# Patient Record
Sex: Male | Born: 1990
Health system: Southern US, Community
[De-identification: ages and names within clinical notes are randomized; demographics above are authoritative.]

## PROBLEM LIST (undated history)

## (undated) DIAGNOSIS — F329 Major depressive disorder, single episode, unspecified: Secondary | ICD-10-CM

## (undated) DIAGNOSIS — F319 Bipolar disorder, unspecified: Secondary | ICD-10-CM

## (undated) DIAGNOSIS — F32A Depression, unspecified: Secondary | ICD-10-CM

## (undated) DIAGNOSIS — F419 Anxiety disorder, unspecified: Secondary | ICD-10-CM

## (undated) DIAGNOSIS — F4310 Post-traumatic stress disorder, unspecified: Secondary | ICD-10-CM

## (undated) DIAGNOSIS — F40240 Claustrophobia: Secondary | ICD-10-CM

## (undated) HISTORY — PX: KNEE SURGERY: SHX244

---
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
## 2008-09-27 ENCOUNTER — Emergency Department: Payer: Self-pay | Admitting: Emergency Medicine

## 2008-10-03 ENCOUNTER — Emergency Department: Payer: Self-pay | Admitting: Emergency Medicine

## 2008-10-11 ENCOUNTER — Emergency Department: Payer: Self-pay | Admitting: Emergency Medicine

## 2010-11-21 ENCOUNTER — Emergency Department: Payer: Self-pay | Admitting: Emergency Medicine

## 2012-12-02 ENCOUNTER — Emergency Department: Payer: Self-pay | Admitting: Emergency Medicine

## 2013-05-26 ENCOUNTER — Emergency Department: Payer: Self-pay | Admitting: Emergency Medicine

## 2013-05-27 LAB — BETA STREP CULTURE(ARMC)

## 2013-10-06 ENCOUNTER — Emergency Department: Payer: Self-pay | Admitting: Emergency Medicine

## 2014-06-01 ENCOUNTER — Emergency Department: Payer: Self-pay | Admitting: Emergency Medicine

## 2014-11-11 ENCOUNTER — Encounter: Payer: Self-pay | Admitting: Emergency Medicine

## 2014-11-11 DIAGNOSIS — Y9389 Activity, other specified: Secondary | ICD-10-CM | POA: Insufficient documentation

## 2014-11-11 DIAGNOSIS — F3130 Bipolar disorder, current episode depressed, mild or moderate severity, unspecified: Secondary | ICD-10-CM | POA: Insufficient documentation

## 2014-11-11 DIAGNOSIS — F1410 Cocaine abuse, uncomplicated: Secondary | ICD-10-CM | POA: Insufficient documentation

## 2014-11-11 DIAGNOSIS — F4310 Post-traumatic stress disorder, unspecified: Secondary | ICD-10-CM | POA: Insufficient documentation

## 2014-11-11 DIAGNOSIS — Y9289 Other specified places as the place of occurrence of the external cause: Secondary | ICD-10-CM | POA: Insufficient documentation

## 2014-11-11 DIAGNOSIS — Y288XXA Contact with other sharp object, undetermined intent, initial encounter: Secondary | ICD-10-CM | POA: Insufficient documentation

## 2014-11-11 DIAGNOSIS — Z720 Tobacco use: Secondary | ICD-10-CM | POA: Insufficient documentation

## 2014-11-11 DIAGNOSIS — F1210 Cannabis abuse, uncomplicated: Secondary | ICD-10-CM | POA: Insufficient documentation

## 2014-11-11 DIAGNOSIS — Y998 Other external cause status: Secondary | ICD-10-CM | POA: Insufficient documentation

## 2014-11-11 DIAGNOSIS — S61011A Laceration without foreign body of right thumb without damage to nail, initial encounter: Secondary | ICD-10-CM | POA: Insufficient documentation

## 2014-11-11 LAB — URINALYSIS COMPLETE WITH MICROSCOPIC (ARMC ONLY)
BILIRUBIN URINE: NEGATIVE
Bacteria, UA: NONE SEEN
Glucose, UA: NEGATIVE mg/dL
HGB URINE DIPSTICK: NEGATIVE
KETONES UR: NEGATIVE mg/dL
LEUKOCYTES UA: NEGATIVE
Nitrite: NEGATIVE
PH: 6 (ref 5.0–8.0)
Protein, ur: NEGATIVE mg/dL
SPECIFIC GRAVITY, URINE: 1.021 (ref 1.005–1.030)
Squamous Epithelial / LPF: NONE SEEN

## 2014-11-11 LAB — COMPREHENSIVE METABOLIC PANEL
ALT: 22 U/L (ref 17–63)
AST: 26 U/L (ref 15–41)
Albumin: 4.6 g/dL (ref 3.5–5.0)
Alkaline Phosphatase: 60 U/L (ref 38–126)
Anion gap: 9 (ref 5–15)
BUN: 14 mg/dL (ref 6–20)
CHLORIDE: 104 mmol/L (ref 101–111)
CO2: 26 mmol/L (ref 22–32)
Calcium: 9.1 mg/dL (ref 8.9–10.3)
Creatinine, Ser: 0.82 mg/dL (ref 0.61–1.24)
Glucose, Bld: 99 mg/dL (ref 65–99)
POTASSIUM: 3.7 mmol/L (ref 3.5–5.1)
Sodium: 139 mmol/L (ref 135–145)
Total Bilirubin: 0.6 mg/dL (ref 0.3–1.2)
Total Protein: 8 g/dL (ref 6.5–8.1)

## 2014-11-11 LAB — URINE DRUG SCREEN, QUALITATIVE (ARMC ONLY)
Amphetamines, Ur Screen: NOT DETECTED
BARBITURATES, UR SCREEN: NOT DETECTED
BENZODIAZEPINE, UR SCRN: NOT DETECTED
Cannabinoid 50 Ng, Ur ~~LOC~~: POSITIVE — AB
Cocaine Metabolite,Ur ~~LOC~~: POSITIVE — AB
MDMA (Ecstasy)Ur Screen: NOT DETECTED
Methadone Scn, Ur: NOT DETECTED
OPIATE, UR SCREEN: NOT DETECTED
PHENCYCLIDINE (PCP) UR S: NOT DETECTED
Tricyclic, Ur Screen: NOT DETECTED

## 2014-11-11 LAB — CBC WITH DIFFERENTIAL/PLATELET
BASOS ABS: 0.1 10*3/uL (ref 0–0.1)
BASOS PCT: 1 %
EOS ABS: 0.1 10*3/uL (ref 0–0.7)
EOS PCT: 1 %
HCT: 46.6 % (ref 40.0–52.0)
Hemoglobin: 15.4 g/dL (ref 13.0–18.0)
LYMPHS PCT: 29 %
Lymphs Abs: 2.6 10*3/uL (ref 1.0–3.6)
MCH: 30.7 pg (ref 26.0–34.0)
MCHC: 33 g/dL (ref 32.0–36.0)
MCV: 93.1 fL (ref 80.0–100.0)
MONO ABS: 0.8 10*3/uL (ref 0.2–1.0)
Monocytes Relative: 9 %
Neutro Abs: 5.4 10*3/uL (ref 1.4–6.5)
Neutrophils Relative %: 60 %
PLATELETS: 270 10*3/uL (ref 150–440)
RBC: 5.01 MIL/uL (ref 4.40–5.90)
RDW: 13.9 % (ref 11.5–14.5)
WBC: 8.9 10*3/uL (ref 3.8–10.6)

## 2014-11-11 LAB — ETHANOL: ALCOHOL ETHYL (B): 95 mg/dL — AB (ref <5)

## 2014-11-11 NOTE — ED Notes (Signed)
Pt arrived to the ED via [facility] department for voluntary commitment. Pt states that he had an argument with his wife over the phone and threw a picture frame and cut himself in there right hand. He went to his mother's house and made a statement: "good bye" and his mother called the police and came under voluntary commitment. Pt is a [place] veteran and states that he has PTSD and his marital problems are making his really depressed.

## 2014-11-12 ENCOUNTER — Emergency Department
Admission: EM | Admit: 2014-11-12 | Discharge: 2014-11-12 | Discharge status: Against Medical Advice | Payer: Self-pay | Attending: Emergency Medicine | Admitting: Emergency Medicine

## 2014-11-12 DIAGNOSIS — F4310 Post-traumatic stress disorder, unspecified: Secondary | ICD-10-CM

## 2014-11-12 DIAGNOSIS — F32A Depression, unspecified: Secondary | ICD-10-CM

## 2014-11-12 DIAGNOSIS — F329 Major depressive disorder, single episode, unspecified: Secondary | ICD-10-CM

## 2014-11-12 HISTORY — DX: Claustrophobia: F40.240

## 2014-11-12 HISTORY — DX: Bipolar disorder, unspecified: F31.9

## 2014-11-12 HISTORY — DX: Major depressive disorder, single episode, unspecified: F32.9

## 2014-11-12 HISTORY — DX: Anxiety disorder, unspecified: F41.9

## 2014-11-12 HISTORY — DX: Depression, unspecified: F32.A

## 2014-11-12 HISTORY — DX: Post-traumatic stress disorder, unspecified: F43.10

## 2014-11-12 NOTE — ED Notes (Signed)
Pt called out to nurses station and reported wanting to leave ER medical facility. MD made aware. MD advised to contact emergency contact relatives and mother to receive more information on current situation. Attempts have been made to sister and spouse with unsuccessful outcomes. Will make MD aware.

## 2014-11-12 NOTE — ED Notes (Signed)

## 2014-11-12 NOTE — BH Assessment (Signed)
Assessment Note  Nicholas Lopez is an 24 y.o. male. Mr. Petz reports that his mother wanted him to come in tonight to seek assistance.  He reports that he was drinking a lot and his mother became worried.  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  He reports that he has been depressed lately and had a difficult time falling asleep.  He has also not been eating regularly, and has lost about 35 pounds in the last 2 months.  He denied homicidal and suicidal ideations or intent. He reports using Alcohol and marijuana as ways of coping with his anxiety.  UDS reported positive results for cannabinoids and cocaine.  Axis I: Alcohol Abuse, Rule out Major Depression and Substance Abuse Axis II: Deferred Axis III:  Past Medical History  Diagnosis Date  . PTSD (post-traumatic stress disorder)   . Depression   . Bipolar 1 disorder   . Anxiety   . Claustrophobia    Axis IV: other psychosocial or environmental problems Axis V: 51-60 moderate symptoms  Past Medical History:  Past Medical History  Diagnosis Date  . PTSD (post-traumatic stress disorder)   . Depression   . Bipolar 1 disorder   . Anxiety   . Claustrophobia     Past Surgical History  Procedure Laterality Date  . Knee surgery      Family History: History reviewed. No pertinent family history.  Social History:  reports that he has been smoking Cigarettes.  He has a 15 pack-year smoking history. He does not have any smokeless tobacco history on file. He reports that he drinks about 16.8 oz of alcohol per week. He reports that he uses illicit drugs (Marijuana and Cocaine).  Additional Social History:  Alcohol / Drug Use History of alcohol / drug use?: Yes Negative Consequences of Use: Personal relationships Substance #1 Name of Substance 1: Alcohol (Beer and Liqour) 1 - Age of First Use: 12 1 - Amount (size/oz): a lot 1 - Frequency: daily 1 - Last Use / Amount: 11/11/2014 Substance #2 Name of Substance 2:  marijuana 2 - Age of First Use: 10 2 - Amount (size/oz): 1/4 (7 blunts) 2 - Frequency: daily 2 - Last Use / Amount: 11/11/2014  CIWA: CIWA-Ar BP: 117/65 mmHg Pulse Rate: 74 COWS:    Allergies: No Known Allergies  Home Medications:  (Not in a hospital admission)  OB/GYN Status:  No LMP for male patient.  General Assessment Data Location of Assessment: [facility] ED TTS Assessment: In system Is this a Tele or Face-to-Face Assessment?: Face-to-Face Is this an Initial Assessment or a Re-assessment for this encounter?: Initial Assessment Marital status: Separated Living Arrangements: Other relatives Can pt return to current living arrangement?: Yes Admission Status: Voluntary Is patient capable of signing voluntary admission?: Yes Referral Source: Self/Family/Friend Insurance type: none  Medical Screening Exam [facility] Walk-in ONLY) Medical Exam completed: Yes  Crisis Care Plan Living Arrangements: Other relatives Name of Psychiatrist: None Name of Therapist: None  Education Status Is patient currently in school?: No Current Grade: na Highest grade of school patient has completed: 12th Name of school: n/a Contact person: na  Risk to self with the past 6 months Suicidal Ideation: No Has patient been a risk to self within the past 6 months prior to admission? : No Suicidal Intent: No Has patient had any suicidal intent within the past 6 months prior to admission? : No Is patient at risk for suicide?: No Suicidal Plan?: No Has patient had  any suicidal plan within the past 6 months prior to admission? : No Access to Means: No What has been your use of drugs/alcohol within the last 12 months?: use alcohol and marijuana all day Previous Attempts/Gestures: No How many times?: 0 Other Self Harm Risks: none reported Triggers for Past Attempts: Spouse contact (Currently seperated) Intentional Self Injurious Behavior: None Family Suicide History: Yes (Brother and sister) Recent  stressful life event(s): Divorce (divorce pending) Persecutory voices/beliefs?: No Depression: Yes Depression Symptoms: Insomnia (Alchol intake increase) Substance abuse history and/or treatment for substance abuse?: Yes Suicide prevention information given to non-admitted patients: Not applicable  Risk to Others within the past 6 months Homicidal Ideation: No Does patient have any lifetime risk of violence toward others beyond the six months prior to admission? : No Thoughts of Harm to Others: No Current Homicidal Intent: No Current Homicidal Plan: No Access to Homicidal Means: No Identified Victim: None identified History of harm to others?: No Assessment of Violence: None Noted Violent Behavior Description: none reported Does patient have access to weapons?: No Criminal Charges Pending?: No Does patient have a court date: No Is patient on probation?: No  Psychosis Hallucinations: None noted Delusions: None noted  Mental Status Report Appearance/Hygiene: In scrubs, Unremarkable Eye Contact: Fair Motor Activity: Unremarkable Speech: Unremarkable Level of Consciousness: Drowsy Mood: Pleasant Affect: Appropriate to circumstance Anxiety Level: None Thought Processes: Coherent Judgement: Partial Orientation: Person, Time, Place, Situation Obsessive Compulsive Thoughts/Behaviors: None  Cognitive Functioning Concentration: Poor Memory: Unable to Assess IQ: Average Insight: Poor Impulse Control: Unable to Assess Appetite: Poor Weight Loss: 35 (2 months) Sleep: Decreased Vegetative Symptoms: None  ADLScreening [facility] Assessment Services) Patient's cognitive ability adequate to safely complete daily activities?: Yes Patient able to express need for assistance with ADLs?: Yes Independently performs ADLs?: Yes (appropriate for developmental age)  Prior Inpatient Therapy Prior Inpatient Therapy: No  Prior Outpatient Therapy Prior Outpatient Therapy: No Does patient  have an ACCT team?: No Does patient have Intensive In-House Services?  : No Does patient have [facility] services? : No Does patient have P4CC services?: No  ADL Screening (condition at time of admission) Patient's cognitive ability adequate to safely complete daily activities?: Yes Patient able to express need for assistance with ADLs?: Yes Independently performs ADLs?: Yes (appropriate for developmental age)       Abuse/Neglect Assessment (Assessment to be complete while patient is alone) Physical Abuse: Denies Verbal Abuse: Denies Sexual Abuse: Denies Exploitation of patient/patient's resources: Denies Self-Neglect: Denies Values / Beliefs Cultural Requests During Hospitalization: None Spiritual Requests During Hospitalization: None   Advance Directives (For Healthcare) Does patient have an advance directive?: No Would patient like information on creating an advanced directive?: No - patient declined information    Additional Information 1:1 In Past 12 Months?: No CIRT Risk: No Elopement Risk: No Does patient have medical clearance?: Yes     Disposition:  Disposition Initial Assessment Completed for this Encounter: Yes Disposition of Patient: Treatment offered and refused  On Site Evaluation by:   Reviewed with Physician:    Justice Deeds 11/12/2014 7:56 AM

## 2014-11-12 NOTE — ED Provider Notes (Signed)
[facility] Emergency Department Provider Note  ____________________________________________  Time seen: Approximately 0052 AM  I have reviewed the triage vital signs and the nursing notes.   HISTORY  Chief Complaint Laceration and Psychiatric Evaluation    HPI Nicholas Lopez is a 24 y.o. male who has been having some problems with depression. The patient reports that he is a veteran and has a history of PTSD. He reports that he has been drinking a lot and his mother was worried about him. He reports that he is under a lot of stress and is undergoing a divorce with his wife. He has not been able to talk to anyone he reports in a while. The patient denies any suicidal ideation but reports that he was drinking a lot and his mother was worried. The patient reports that he threw a picture frame and cut his hand 3 days ago. He reports that he was never evaluated for it. He is unsure how much she has been drinking but reports that he drinks every day. The patient is here to see if there is someone he could talk to about his PTSD and his depression.   Past Medical History  Diagnosis Date  . PTSD (post-traumatic stress disorder)   . Depression   . Bipolar 1 disorder   . Anxiety   . Claustrophobia     There are no active problems to display for this patient.   Past Surgical History  Procedure Laterality Date  . Knee surgery      No current outpatient prescriptions on file.  Allergies Review of patient's allergies indicates no known allergies.  History reviewed. No pertinent family history.  Social History Social History  Substance Use Topics  . Smoking status: Heavy Tobacco Smoker -- 1.00 packs/day for 15 years    Types: Cigarettes  . Smokeless tobacco: None  . Alcohol Use: 16.8 oz/week    18 Shots of liquor, 10 Cans of beer per week    Review of Systems Constitutional: No fever/chills Eyes: No visual changes. ENT: No sore throat. Cardiovascular:  Denies chest pain. Respiratory: Denies shortness of breath. Gastrointestinal: No abdominal pain.  No nausea, no vomiting.  No diarrhea.  No constipation. Genitourinary: Negative for dysuria. Musculoskeletal: Negative for back pain. Skin: Negative for rash. Neurological: Negative for headaches, focal weakness or numbness.  10-point ROS otherwise negative.  ____________________________________________   PHYSICAL EXAM:  VITAL SIGNS: ED Triage Vitals  Enc Vitals Group     BP 11/11/14 2222 118/67 mmHg     Pulse Rate 11/11/14 2222 70     Resp 11/11/14 2222 18     Temp 11/11/14 2222 98.5 F (36.9 C)     Temp Source 11/11/14 2222 Oral     SpO2 11/11/14 2222 98 %     Weight 11/11/14 2222 155 lb (70.308 kg)     Height 11/11/14 2222 5\' 7"  (1.702 m)     Head Cir --      Peak Flow --      Pain Score 11/11/14 2224 7     Pain Loc --      Pain Edu? --      Excl. in GC? --     Constitutional: Alert and oriented. Well appearing and in no acute distress. Eyes: Conjunctivae are normal. PERRL. EOMI. Head: Atraumatic. Nose: No congestion/rhinnorhea. Mouth/Throat: Mucous membranes are moist.  Oropharynx non-erythematous. Cardiovascular: Normal rate, regular rhythm. Grossly normal heart sounds.  Good peripheral circulation. Respiratory: Normal respiratory effort.  No  retractions. Lungs CTAB. Gastrointestinal: Soft and nontender. No distention. Positive bowel sounds Musculoskeletal: No lower extremity tenderness nor edema.   Neurologic:  Normal speech and language. No gross focal neurologic deficits are appreciated.  Skin:  Healing, not bleeding laceration to the base of the patient's right thumb in a normal pain to palpation passive range of motion intact. Psychiatric: Mood and affect are normal.  ____________________________________________   LABS (all labs ordered are listed, but only abnormal results are displayed)  Labs Reviewed  ETHANOL - Abnormal; Notable for the following:     Alcohol, Ethyl (B) 95 (*)    All other components within normal limits  URINALYSIS COMPLETEWITH MICROSCOPIC ([facility] ONLY) - Abnormal; Notable for the following:    Color, Urine YELLOW (*)    APPearance CLEAR (*)    All other components within normal limits  URINE DRUG SCREEN, QUALITATIVE ([facility] ONLY) - Abnormal; Notable for the following:    Cocaine Metabolite,Ur [place] POSITIVE (*)    Cannabinoid 50 Ng, Ur [place] POSITIVE (*)    All other components within normal limits  CBC WITH DIFFERENTIAL/PLATELET  COMPREHENSIVE METABOLIC PANEL   ____________________________________________  EKG  None ____________________________________________  RADIOLOGY  None ____________________________________________   PROCEDURES  Procedure(s) performed: None  Critical Care performed: No  ____________________________________________   INITIAL IMPRESSION / ASSESSMENT AND PLAN / ED COURSE  Pertinent labs & imaging results that were available during my care of the patient were reviewed by me and considered in my medical decision making (see chart for details).  This is a 24 year old male who is a veteran with PTSD has been having a lot of depression. The patient is here saying he just wants to talk to someone about his depression. The patient denies any suicidal or homicidal ideation at this time. We are awaiting evaluation by psych in the morning.  The patient initially slept well in the emergency department. After a few hours he decided he no longer wanted to be here. The patient denies any suicidal thoughts of suicidal ideation. He reports that he is stressed and having a hard time especially given the divorce that he is going through with his wife. We informed the patient that he will be seen by the psychiatrist this morning but he decided he no longer wanted to stay. The patient decided to sign out AGAINST MEDICAL ADVICE. The patient did not want to wait any longer to see the psychiatrist.   ____________________________________________   FINAL CLINICAL IMPRESSION(S) / ED DIAGNOSES  Final diagnoses:  Depression  PTSD (post-traumatic stress disorder)      Rebecka Apley, MD 11/12/14 [phone]

## 2014-11-12 NOTE — ED Notes (Signed)
Pt left ER medical facility AMA. Pt given opportunity to stay until morning to speak to a psychiatrist. Pt denied opportunity to stay for further treatment. MD made aware of pt response. Pt left ER facility with steady gait and in NAD.

## 2020-03-28 HISTORY — PX: CHEST TUBE INSERTION: SHX231

## 2020-04-16 ENCOUNTER — Inpatient Hospital Stay (HOSPITAL_COMMUNITY): Payer: No Typology Code available for payment source

## 2020-04-16 ENCOUNTER — Encounter (HOSPITAL_COMMUNITY): Payer: Self-pay | Admitting: Emergency Medicine

## 2020-04-16 ENCOUNTER — Inpatient Hospital Stay (HOSPITAL_COMMUNITY)
Admission: EM | Admit: 2020-04-16 | Discharge: 2020-04-24 | DRG: 956 | Disposition: A | Discharge status: Home Health | Payer: No Typology Code available for payment source | Attending: Surgery | Admitting: Surgery

## 2020-04-16 ENCOUNTER — Emergency Department (HOSPITAL_COMMUNITY): Payer: No Typology Code available for payment source

## 2020-04-16 ENCOUNTER — Other Ambulatory Visit: Payer: Self-pay

## 2020-04-16 DIAGNOSIS — I959 Hypotension, unspecified: Secondary | ICD-10-CM | POA: Diagnosis present

## 2020-04-16 DIAGNOSIS — S01111A Laceration without foreign body of right eyelid and periocular area, initial encounter: Secondary | ICD-10-CM | POA: Diagnosis present

## 2020-04-16 DIAGNOSIS — N179 Acute kidney failure, unspecified: Secondary | ICD-10-CM | POA: Diagnosis present

## 2020-04-16 DIAGNOSIS — E876 Hypokalemia: Secondary | ICD-10-CM | POA: Diagnosis present

## 2020-04-16 DIAGNOSIS — J156 Pneumonia due to other aerobic Gram-negative bacteria: Secondary | ICD-10-CM | POA: Diagnosis not present

## 2020-04-16 DIAGNOSIS — J15212 Pneumonia due to Methicillin resistant Staphylococcus aureus: Secondary | ICD-10-CM | POA: Diagnosis not present

## 2020-04-16 DIAGNOSIS — S01119A Laceration without foreign body of unspecified eyelid and periocular area, initial encounter: Secondary | ICD-10-CM

## 2020-04-16 DIAGNOSIS — S2249XA Multiple fractures of ribs, unspecified side, initial encounter for closed fracture: Secondary | ICD-10-CM

## 2020-04-16 DIAGNOSIS — R402421 Glasgow coma scale score 9-12, in the field [EMT or ambulance]: Secondary | ICD-10-CM | POA: Diagnosis present

## 2020-04-16 DIAGNOSIS — S2500XA Unspecified injury of thoracic aorta, initial encounter: Secondary | ICD-10-CM

## 2020-04-16 DIAGNOSIS — J439 Emphysema, unspecified: Secondary | ICD-10-CM | POA: Diagnosis present

## 2020-04-16 DIAGNOSIS — S82002A Unspecified fracture of left patella, initial encounter for closed fracture: Secondary | ICD-10-CM

## 2020-04-16 DIAGNOSIS — S62512A Displaced fracture of proximal phalanx of left thumb, initial encounter for closed fracture: Secondary | ICD-10-CM

## 2020-04-16 DIAGNOSIS — Z20822 Contact with and (suspected) exposure to covid-19: Secondary | ICD-10-CM | POA: Diagnosis present

## 2020-04-16 DIAGNOSIS — S0531XA Ocular laceration without prolapse or loss of intraocular tissue, right eye, initial encounter: Secondary | ICD-10-CM | POA: Diagnosis present

## 2020-04-16 DIAGNOSIS — S72462A Displaced supracondylar fracture with intracondylar extension of lower end of left femur, initial encounter for closed fracture: Secondary | ICD-10-CM

## 2020-04-16 DIAGNOSIS — S53105A Unspecified dislocation of left ulnohumeral joint, initial encounter: Secondary | ICD-10-CM

## 2020-04-16 DIAGNOSIS — D62 Acute posthemorrhagic anemia: Secondary | ICD-10-CM | POA: Diagnosis present

## 2020-04-16 DIAGNOSIS — R0902 Hypoxemia: Secondary | ICD-10-CM

## 2020-04-16 DIAGNOSIS — S36115A Moderate laceration of liver, initial encounter: Secondary | ICD-10-CM | POA: Diagnosis present

## 2020-04-16 DIAGNOSIS — S2243XA Multiple fractures of ribs, bilateral, initial encounter for closed fracture: Secondary | ICD-10-CM | POA: Diagnosis present

## 2020-04-16 DIAGNOSIS — S72302A Unspecified fracture of shaft of left femur, initial encounter for closed fracture: Secondary | ICD-10-CM

## 2020-04-16 DIAGNOSIS — Z01818 Encounter for other preprocedural examination: Secondary | ICD-10-CM

## 2020-04-16 DIAGNOSIS — T148XXA Other injury of unspecified body region, initial encounter: Secondary | ICD-10-CM

## 2020-04-16 DIAGNOSIS — T1490XA Injury, unspecified, initial encounter: Secondary | ICD-10-CM | POA: Diagnosis present

## 2020-04-16 DIAGNOSIS — S36039A Unspecified laceration of spleen, initial encounter: Secondary | ICD-10-CM | POA: Diagnosis present

## 2020-04-16 DIAGNOSIS — J939 Pneumothorax, unspecified: Secondary | ICD-10-CM

## 2020-04-16 DIAGNOSIS — S32029A Unspecified fracture of second lumbar vertebra, initial encounter for closed fracture: Secondary | ICD-10-CM | POA: Diagnosis present

## 2020-04-16 DIAGNOSIS — S2242XA Multiple fractures of ribs, left side, initial encounter for closed fracture: Secondary | ICD-10-CM

## 2020-04-16 DIAGNOSIS — Z9289 Personal history of other medical treatment: Secondary | ICD-10-CM

## 2020-04-16 DIAGNOSIS — S81811A Laceration without foreign body, right lower leg, initial encounter: Secondary | ICD-10-CM | POA: Diagnosis present

## 2020-04-16 DIAGNOSIS — J9601 Acute respiratory failure with hypoxia: Secondary | ICD-10-CM

## 2020-04-16 DIAGNOSIS — S3509XA Other injury of abdominal aorta, initial encounter: Secondary | ICD-10-CM

## 2020-04-16 DIAGNOSIS — S72362A Displaced segmental fracture of shaft of left femur, initial encounter for closed fracture: Secondary | ICD-10-CM | POA: Diagnosis present

## 2020-04-16 DIAGNOSIS — J9690 Respiratory failure, unspecified, unspecified whether with hypoxia or hypercapnia: Secondary | ICD-10-CM

## 2020-04-16 DIAGNOSIS — Y92410 Unspecified street and highway as the place of occurrence of the external cause: Secondary | ICD-10-CM

## 2020-04-16 DIAGNOSIS — S36113A Laceration of liver, unspecified degree, initial encounter: Secondary | ICD-10-CM

## 2020-04-16 DIAGNOSIS — S53115A Anterior dislocation of left ulnohumeral joint, initial encounter: Secondary | ICD-10-CM

## 2020-04-16 DIAGNOSIS — S7292XA Unspecified fracture of left femur, initial encounter for closed fracture: Secondary | ICD-10-CM

## 2020-04-16 DIAGNOSIS — S270XXA Traumatic pneumothorax, initial encounter: Secondary | ICD-10-CM | POA: Diagnosis present

## 2020-04-16 DIAGNOSIS — Z4659 Encounter for fitting and adjustment of other gastrointestinal appliance and device: Secondary | ICD-10-CM

## 2020-04-16 DIAGNOSIS — S72002A Fracture of unspecified part of neck of left femur, initial encounter for closed fracture: Secondary | ICD-10-CM

## 2020-04-16 DIAGNOSIS — M25531 Pain in right wrist: Secondary | ICD-10-CM

## 2020-04-16 DIAGNOSIS — R202 Paresthesia of skin: Secondary | ICD-10-CM | POA: Diagnosis present

## 2020-04-16 DIAGNOSIS — Z4682 Encounter for fitting and adjustment of non-vascular catheter: Secondary | ICD-10-CM

## 2020-04-16 DIAGNOSIS — Z938 Other artificial opening status: Secondary | ICD-10-CM

## 2020-04-16 DIAGNOSIS — S62509A Fracture of unspecified phalanx of unspecified thumb, initial encounter for closed fracture: Secondary | ICD-10-CM

## 2020-04-16 LAB — URINALYSIS, ROUTINE W REFLEX MICROSCOPIC
Bilirubin Urine: NEGATIVE
Glucose, UA: 150 mg/dL — AB
Ketones, ur: NEGATIVE mg/dL
Leukocytes,Ua: NEGATIVE
Nitrite: NEGATIVE
Protein, ur: 100 mg/dL — AB
RBC / HPF: >50 RBC/hpf — ABNORMAL HIGH (ref 0–5)
Specific Gravity, Urine: 1.04 — ABNORMAL HIGH (ref 1.005–1.030)
pH: 7 (ref 5.0–8.0)

## 2020-04-16 LAB — PROTIME-INR
INR: 1.1 (ref 0.8–1.2)
Prothrombin Time: 14.1 seconds (ref 11.4–15.2)

## 2020-04-16 LAB — I-STAT CHEM 8, ED
BUN: 20 mg/dL (ref 6–20)
Calcium, Ion: 1.06 mmol/L — ABNORMAL LOW (ref 1.15–1.40)
Chloride: 105 mmol/L (ref 98–111)
Creatinine, Ser: 1.3 mg/dL — ABNORMAL HIGH (ref 0.61–1.24)
Glucose, Bld: 149 mg/dL — ABNORMAL HIGH (ref 70–99)
HCT: 49 % (ref 39.0–52.0)
Hemoglobin: 16.7 g/dL (ref 13.0–17.0)
Potassium: 2.7 mmol/L — CL (ref 3.5–5.1)
Sodium: 140 mmol/L (ref 135–145)
TCO2: 20 mmol/L — ABNORMAL LOW (ref 22–32)

## 2020-04-16 LAB — I-STAT ARTERIAL BLOOD GAS, ED
Acid-base deficit: 2 mmol/L (ref 0.0–2.0)
Bicarbonate: 24.5 mmol/L (ref 20.0–28.0)
Calcium, Ion: 1.14 mmol/L — ABNORMAL LOW (ref 1.15–1.40)
HCT: 37 % — ABNORMAL LOW (ref 39.0–52.0)
Hemoglobin: 12.6 g/dL — ABNORMAL LOW (ref 13.0–17.0)
O2 Saturation: 100 %
Patient temperature: 96.1
Potassium: 3.5 mmol/L (ref 3.5–5.1)
Sodium: 141 mmol/L (ref 135–145)
TCO2: 26 mmol/L (ref 22–32)
pCO2 arterial: 45.9 mmHg (ref 32.0–48.0)
pH, Arterial: 7.328 — ABNORMAL LOW (ref 7.350–7.450)
pO2, Arterial: 342 mmHg — ABNORMAL HIGH (ref 83.0–108.0)

## 2020-04-16 LAB — COMPREHENSIVE METABOLIC PANEL
ALT: 514 U/L — ABNORMAL HIGH (ref 0–44)
AST: 773 U/L — ABNORMAL HIGH (ref 15–41)
Albumin: 4.3 g/dL (ref 3.5–5.0)
Alkaline Phosphatase: 56 U/L (ref 38–126)
Anion gap: 21 — ABNORMAL HIGH (ref 5–15)
BUN: 16 mg/dL (ref 6–20)
CO2: 18 mmol/L — ABNORMAL LOW (ref 22–32)
Calcium: 9.2 mg/dL (ref 8.9–10.3)
Chloride: 101 mmol/L (ref 98–111)
Creatinine, Ser: 1.4 mg/dL — ABNORMAL HIGH (ref 0.61–1.24)
GFR, Estimated: >60 mL/min (ref >60)
Glucose, Bld: 160 mg/dL — ABNORMAL HIGH (ref 70–99)
Potassium: 2.7 mmol/L — CL (ref 3.5–5.1)
Sodium: 140 mmol/L (ref 135–145)
Total Bilirubin: 1 mg/dL (ref 0.3–1.2)
Total Protein: 7.8 g/dL (ref 6.5–8.1)

## 2020-04-16 LAB — CBC
HCT: 47.3 % (ref 39.0–52.0)
Hemoglobin: 15.9 g/dL (ref 13.0–17.0)
MCH: 31.8 pg (ref 26.0–34.0)
MCHC: 33.6 g/dL (ref 30.0–36.0)
MCV: 94.6 fL (ref 80.0–100.0)
Platelets: 345 10*3/uL (ref 150–400)
RBC: 5 MIL/uL (ref 4.22–5.81)
RDW: 13.5 % (ref 11.5–15.5)
WBC: 22.2 10*3/uL — ABNORMAL HIGH (ref 4.0–10.5)
nRBC: 0 % (ref 0.0–0.2)

## 2020-04-16 LAB — SAMPLE TO BLOOD BANK

## 2020-04-16 LAB — ETHANOL: Alcohol, Ethyl (B): <10 mg/dL (ref <10)

## 2020-04-16 LAB — SARS CORONAVIRUS 2 BY RT PCR (HOSPITAL ORDER, PERFORMED IN ~~LOC~~ HOSPITAL LAB): SARS Coronavirus 2: NEGATIVE

## 2020-04-16 LAB — LACTIC ACID, PLASMA: Lactic Acid, Venous: 9.7 mmol/L (ref 0.5–1.9)

## 2020-04-16 MED ORDER — FENTANYL BOLUS VIA INFUSION
50.0000 ug | INTRAVENOUS | Status: DC | PRN
Start: 2020-04-16 — End: 2020-04-18
  Filled 2020-04-16: qty 50

## 2020-04-16 MED ORDER — FENTANYL CITRATE (PF) 100 MCG/2ML IJ SOLN
INTRAMUSCULAR | Status: AC
  Filled 2020-04-16: qty 2

## 2020-04-16 MED ORDER — FENTANYL CITRATE (PF) 100 MCG/2ML IJ SOLN: 50.0000 ug | Freq: Once | INTRAMUSCULAR | Status: DC

## 2020-04-16 MED ORDER — PROPOFOL 10 MG/ML IV BOLUS
INTRAVENOUS | Status: AC
  Filled 2020-04-16: qty 20

## 2020-04-16 MED ORDER — PROPOFOL 1000 MG/100ML IV EMUL: 5.0000 ug/kg/min | INTRAVENOUS | Status: DC

## 2020-04-16 MED ORDER — ETOMIDATE 2 MG/ML IV SOLN
INTRAVENOUS | Status: AC | PRN
  Administered 2020-04-16: 20 mg via INTRAVENOUS

## 2020-04-16 MED ORDER — SODIUM CHLORIDE 0.9 % IV SOLN
INTRAVENOUS | Status: AC | PRN
  Administered 2020-04-16: 500 mL via INTRAVENOUS

## 2020-04-16 MED ORDER — IOHEXOL 300 MG/ML  SOLN
100.0000 mL | Freq: Once | INTRAMUSCULAR | Status: AC | PRN
  Administered 2020-04-16: 100 mL via INTRAVENOUS

## 2020-04-16 MED ORDER — DOCUSATE SODIUM 50 MG/5ML PO LIQD
100.0000 mg | Freq: Two times a day (BID) | ORAL | Status: DC
  Administered 2020-04-17 – 2020-04-20 (×7): 100 mg
  Filled 2020-04-16 (×9): qty 10

## 2020-04-16 MED ORDER — ONDANSETRON HCL 4 MG/2ML IJ SOLN
4.0000 mg | Freq: Four times a day (QID) | INTRAMUSCULAR | Status: DC | PRN
  Administered 2020-04-22: 4 mg via INTRAVENOUS
  Filled 2020-04-16: qty 2

## 2020-04-16 MED ORDER — FENTANYL CITRATE (PF) 100 MCG/2ML IJ SOLN
INTRAMUSCULAR | Status: AC | PRN
  Administered 2020-04-16: 100 ug via INTRAVENOUS

## 2020-04-16 MED ORDER — PROPOFOL 1000 MG/100ML IV EMUL
0.0000 ug/kg/min | INTRAVENOUS | Status: DC
  Administered 2020-04-16: 20 ug/kg/min via INTRAVENOUS
  Administered 2020-04-17 – 2020-04-18 (×2): 40 ug/kg/min via INTRAVENOUS
  Administered 2020-04-18: 50 ug/kg/min via INTRAVENOUS
  Filled 2020-04-16 (×3): qty 100

## 2020-04-16 MED ORDER — ONDANSETRON 4 MG PO TBDP: 4.0000 mg | ORAL_TABLET | Freq: Four times a day (QID) | ORAL | Status: DC | PRN

## 2020-04-16 MED ORDER — POTASSIUM CHLORIDE IN NACL 20-0.9 MEQ/L-% IV SOLN
INTRAVENOUS | Status: DC
  Filled 2020-04-16 (×4): qty 1000

## 2020-04-16 MED ORDER — POLYETHYLENE GLYCOL 3350 17 G PO PACK
17.0000 g | PACK | Freq: Every day | ORAL | Status: DC
  Administered 2020-04-18 – 2020-04-20 (×3): 17 g
  Filled 2020-04-16 (×3): qty 1

## 2020-04-16 MED ORDER — ENOXAPARIN SODIUM 30 MG/0.3ML ~~LOC~~ SOLN
30.0000 mg | Freq: Two times a day (BID) | SUBCUTANEOUS | Status: DC
  Administered 2020-04-18 (×2): 30 mg via SUBCUTANEOUS
  Filled 2020-04-16 (×2): qty 0.3

## 2020-04-16 MED ORDER — PROPOFOL 1000 MG/100ML IV EMUL
INTRAVENOUS | Status: AC | PRN
  Administered 2020-04-16: 5 ug via INTRAVENOUS

## 2020-04-16 MED ORDER — METOPROLOL TARTRATE 5 MG/5ML IV SOLN: 5.0000 mg | Freq: Four times a day (QID) | INTRAVENOUS | Status: DC | PRN

## 2020-04-16 MED ORDER — FENTANYL 2500MCG IN NS 250ML (10MCG/ML) PREMIX INFUSION
50.0000 ug/h | INTRAVENOUS | Status: DC
  Administered 2020-04-16: 100 ug/h via INTRAVENOUS
  Administered 2020-04-17: 200 ug/h via INTRAVENOUS
  Filled 2020-04-16 (×3): qty 250

## 2020-04-16 MED ORDER — FENTANYL CITRATE (PF) 100 MCG/2ML IJ SOLN
INTRAMUSCULAR | Status: AC | PRN
  Administered 2020-04-16 (×2): 50 ug via INTRAVENOUS

## 2020-04-16 MED ORDER — PANTOPRAZOLE SODIUM 40 MG IV SOLR
40.0000 mg | Freq: Every day | INTRAVENOUS | Status: DC
  Administered 2020-04-18 – 2020-04-20 (×3): 40 mg via INTRAVENOUS
  Filled 2020-04-16 (×3): qty 40

## 2020-04-16 MED ORDER — SUCCINYLCHOLINE CHLORIDE 20 MG/ML IJ SOLN
INTRAMUSCULAR | Status: AC | PRN
  Administered 2020-04-16: 100 mg via INTRAVENOUS

## 2020-04-16 MED ORDER — PROPOFOL 1000 MG/100ML IV EMUL
INTRAVENOUS | Status: AC
  Administered 2020-04-16: 20 ug/kg/min via INTRAVENOUS
  Filled 2020-04-16: qty 100

## 2020-04-16 MED ORDER — PANTOPRAZOLE SODIUM 40 MG PO TBEC
40.0000 mg | DELAYED_RELEASE_TABLET | Freq: Every day | ORAL | Status: DC
  Administered 2020-04-21: 40 mg via ORAL
  Filled 2020-04-16: qty 1

## 2020-04-16 NOTE — Progress Notes (Signed)
   04/16/20 1754  Clinical Encounter Type  Visited With Patient not available (Chaplain not able to speak with the patient.)  Visit Type Trauma  Referral From  (Level 2 Page Rosanne Ashing to Level 1)  Consult/Referral To Chaplain  Chaplain received page for Level 2 that was upgraded to Level 1. Medical Team was working with patient and no family was present at this time. Chaplain was not needed.  Chaplain Tarnesha Ulloa [place] [phone]

## 2020-04-16 NOTE — ED Provider Notes (Signed)
CHEST TUBE INSERTION  Date/Time: 04/16/2020 8:48 PM Performed by: Gailen Shelter, PA Authorized by: Gailen Shelter, PA   Consent:    Consent obtained:  Emergent situation   Consent given by:  Patient   Risks, benefits, and alternatives were discussed: not applicable     Procedural risks discussed: Pt intubated. Pre-procedure details:    Skin preparation:  Chlorhexidine   Preparation: Patient was prepped and draped in the usual sterile fashion   Sedation:    Sedation type:  Deep Anesthesia:    Anesthesia method:  Local infiltration   Local anesthetic:  Lidocaine 1% w/o epi Procedure details:    Placement location:  L lateral   Tube size (French): pigtail.   Ultrasound guidance: no     Tension pneumothorax: no     Tube connected to:  Suction and water seal   Suture material:  2-0 silk   Dressing: gauze and tape. Post-procedure details:    Post-insertion x-ray findings: tube in good position     Procedure completion:  Tolerated well, no immediate complications Comments:     Left chest tube insertion was overseen by Marcille Blanco.  Marland Kitchen.Laceration Repair  Date/Time: 04/16/2020 8:51 PM Performed by: Gailen Shelter, PA Authorized by: Gailen Shelter, PA   Consent:    Consent obtained:  Verbal   Consent given by:  Patient   Risks discussed:  Infection, need for additional repair, pain, poor cosmetic result and poor wound healing   Alternatives discussed:  No treatment and delayed treatment Universal protocol:    Procedure explained and questions answered to patient or proxy's satisfaction: yes     Relevant documents present and verified: yes     Test results available: yes     Imaging studies available: yes     Required blood products, implants, devices, and special equipment available: yes     Site/side marked: yes     Immediately prior to procedure, a time out was called: yes     Patient identity confirmed:  Verbally with patient Anesthesia:    Anesthesia method:   Local infiltration Laceration details:    Location:  Leg   Leg location:  R lower leg   Length (cm):  5 Exploration:    Hemostasis achieved with:  Direct pressure   Wound extent: no foreign bodies/material noted and no tendon damage noted     Contaminated: no   Treatment:    Area cleansed with:  Saline   Amount of cleaning:  Standard   Irrigation solution:  Sterile saline   Irrigation volume:  200   Irrigation method:  Pressure wash   Visualized foreign bodies/material removed: no   Skin repair:    Repair method:  Staples   Number of staples:  5 Approximation:    Approximation:  Close Post-procedure details:    Dressing:  Open (no dressing)   Procedure completion:  Tolerated well, no immediate complications .Marland KitchenLaceration Repair  Date/Time: 04/16/2020 8:52 PM Performed by: Gailen Shelter, PA Authorized by: Gailen Shelter, PA   Consent:    Consent obtained:  Verbal   Consent given by:  Patient   Risks discussed:  Infection, need for additional repair, pain, poor cosmetic result and poor wound healing   Alternatives discussed:  No treatment and delayed treatment Universal protocol:    Procedure explained and questions answered to patient or proxy's satisfaction: yes     Relevant documents present and verified: yes     Test results available: yes  Imaging studies available: yes     Required blood products, implants, devices, and special equipment available: yes     Site/side marked: yes     Immediately prior to procedure, a time out was called: yes     Patient identity confirmed:  Verbally with patient Anesthesia:    Anesthesia method:  None Laceration details:    Location:  Leg   Leg location:  L lower leg   Length (cm):  2 Exploration:    Hemostasis achieved with:  Direct pressure   Wound exploration: wound explored through full range of motion     Wound extent: no foreign bodies/material noted and no tendon damage noted     Contaminated: no   Treatment:     Area cleansed with:  Saline   Amount of cleaning:  Standard   Irrigation solution:  Sterile saline   Irrigation volume:  200   Irrigation method:  Pressure wash   Visualized foreign bodies/material removed: no   Skin repair:    Repair method:  Staples   Number of staples:  2 Approximation:    Approximation:  Close Post-procedure details:    Dressing:  Open (no dressing)   Procedure completion:  Tolerated well, no immediate complications   Imaging studies completed. Follow-up on chest x-ray after chest tube placement.  Pneumothorax slightly decreased. IMPRESSION:  1. Possibly slightly decreased small volume bilateral  pneumothoraces.  2. Persistent, slightly increased, left chest wall emphysema.     Completed Bilateral lower extremity lacerations repaired with staples. Left-sided pneumothorax decompressed with tail catheter chest tube.   Gailen Shelter, [place] 04/16/20 2053    Pollyann Savoy, MD 04/16/20 2103

## 2020-04-16 NOTE — Progress Notes (Signed)
Orthopedic Tech Progress Note Patient Details:  Nicholas Lopez 1990/09/24 [id number]  Ortho Devices Type of Ortho Device: Knee Immobilizer,Long arm splint Ortho Device/Splint Location: LUE,LLE Ortho Device/Splint Interventions: Ordered,Application,Adjustment   Post Interventions Patient Tolerated: Poor Instructions Provided: Other (comment)   Michelle Piper 04/16/2020, 6:27 PM
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
## 2020-04-16 NOTE — Consult Note (Signed)
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  Bleeding controlled. He tolerated well.    Assessment/Plan: Right eye laceration- it was closed and should have antibiotic cream to the eyelid  BID. No sutures to remove so follow up as needed. Wound instructions should be given. No bony injuries in face.   04/16/2020, 7:33 PM
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
## 2020-04-16 NOTE — ED Notes (Signed)
Primary RN made aware of critical K level.

## 2020-04-16 NOTE — ED Notes (Signed)
Ortho Tech placed long arm splint to LUE and knee immobilizer to LLE

## 2020-04-16 NOTE — ED Provider Notes (Signed)
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 Supplies used: Ortho-Glass  .Ortho Injury Treatment  Date/Time: 04/16/2020 7:02 PM Performed by: Pollyann Savoy, MD Authorized by: Pollyann Savoy, MD   Consent:    Consent obtained:  Harvie Heck location: upper leg Location details: left upper leg Injury type: fracture Fracture type: femoral shaft Pre-procedure neurovascular assessment: neurovascularly intact Immobilization: splint Splint type: knee immobilizer. Supplies used: knee immobilizer.  Procedure Name: Intubation Date/Time: 04/16/2020 7:55 PM Performed by: Pollyann Savoy, MD Pre-anesthesia Checklist: Patient identified, Patient being monitored, Emergency Drugs available, Timeout performed and Suction available Oxygen Delivery Method: Non-rebreather mask Preoxygenation: Pre-oxygenation with 100% oxygen Induction Type: Rapid sequence Ventilation: Mask ventilation without difficulty Laryngoscope Size: Glidescope and 3 Grade View: Grade I Tube size: 8.0 mm Number of attempts: 1 Placement Confirmation: ETT inserted through vocal cords under direct vision,  CO2 detector and Breath sounds checked- equal and bilateral Secured at: 25  cm Tube secured with: ETT holder       Medications Ordered in the ED Medications  fentaNYL (SUBLIMAZE) 100 MCG/2ML injection (has no administration in time range)  fentaNYL (SUBLIMAZE) injection (50 mcg Intravenous Given 04/16/20 1821)  propofol (DIPRIVAN) 10 mg/mL bolus/IV push (has no administration in time range)  enoxaparin (LOVENOX) injection 30 mg (has no administration in time range)  0.9 % NaCl with KCl 20 mEq/ L  infusion (has no administration in time range)  ondansetron (ZOFRAN-ODT) disintegrating tablet 4 mg (has no administration in time range)    Or  ondansetron (ZOFRAN) injection 4 mg (has no administration in time range)  pantoprazole (PROTONIX) EC tablet 40 mg (has no administration in time range)    Or  pantoprazole (PROTONIX) injection 40 mg (has no administration in time range)  metoprolol tartrate (LOPRESSOR) injection 5 mg (has no administration in time range)  docusate (COLACE) 50 MG/5ML liquid 100 mg (has no administration in time range)  polyethylene glycol (MIRALAX / GLYCOLAX) packet 17 g (has no administration in time range)  fentaNYL (SUBLIMAZE) injection 50 mcg (has no administration in time range)  fentaNYL in NS (46mcg/ml) infusion-PREMIX (has no administration in time range)  fentaNYL (SUBLIMAZE) bolus via infusion 50 mcg (has no administration in time range)  propofol (DIPRIVAN) 1000  MG/100ML infusion (has no administration in time range)  etomidate (AMIDATE) injection (20 mg Intravenous Given 04/16/20 1937)  succinylcholine (ANECTINE) injection (100 mg Intravenous Given 04/16/20 1938)  propofol (DIPRIVAN) 1000 MG/100ML infusion (5 mcg Intravenous New Bag/Given 04/16/20 1939)  iohexol (OMNIPAQUE) 300 MG/ML solution 100 mL (100 mLs Intravenous Contrast Given 04/16/20 1838)     MDM Rules/Calculators/A&P MDM  ED Course  I have reviewed the triage vital signs and the nursing notes.  Pertinent labs & imaging results that were available  during my care of the patient were reviewed by me and considered in my medical decision making (see chart for details).  Clinical Course as of 04/16/20 1958  Thu Apr 16, 2020  [id number] Patient's initial BP low and tachycardic, warm saline bolus ordered and upgraded to Level 1, Dr. Corliss Skainssuei at bedside promptly to co-manage. Initial portable CXR and pelvis do no show any significant findings. Portal L elbow shows dislocation, no definite fracture. Portal L femur shows mid shaft and distal fractures with a patellar fracture. Both splinted by myself and Dr. Corliss Skainssuei with ortho tech assistance. And he was taken to CT. HR and BP improving. Fentanyl given for pain.  [CS]  1900 Dr. Corliss Skainssuei has spoke with Ortho regarding the patient's fractures and dislocations. He has also spoke with ENT about eyelid lac. Dr. Corliss Skainssuei will admit for further management.  [CS]  1921 CT images and results reviewed. Discussed with Dr. Corliss Skainssuei, given extent of his injuries and need for multiple bedside procedures, decision was made to electively intubate the patient.  [CS]    Clinical Course User Index [CS] Pollyann SavoySheldon, Elmar Antigua B, MD    Final Clinical Impression(s) / ED Diagnoses Final diagnoses:  Trauma  Closed fracture of multiple ribs of left side, initial encounter  Bilateral pneumothorax  Laceration of eyelid without involvement of lid margin  Liver laceration, closed, initial encounter  Anterior dislocation of left elbow, initial encounter  Displaced fracture of proximal phalanx of left thumb, initial encounter for closed fracture  Closed fracture of left femur, unspecified fracture morphology, initial encounter (HCC)  Closed nondisplaced fracture of left patella, unspecified fracture morphology, initial encounter  Laceration of right lower leg, initial encounter    Rx / DC Orders ED Discharge Orders    None       Pollyann SavoySheldon, Valene Villa B, MD 04/16/20 2131

## 2020-04-16 NOTE — Progress Notes (Signed)
Orthopedic Tech Progress Note Patient Details:  Jamse Arn 03/28/1877 [id number] Level 2 trauma Patient ID: Delorise Royals Doe, male   DOB: 03/28/1877, 30 y.o.   MRN: [id number]   Michelle Piper 04/16/2020, 6:07 PM

## 2020-04-16 NOTE — Consult Note (Addendum)
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 tonight, will discuss with trauma team for surgical intervention probably tomorrow  3. Will XR L thumb and further secondary survey when able.  PROCEDUREs: The patient was intubated and sedated in the emergency department the left elbow was manually reduced with longitudinal traction and flexion.  Once reduce the elbow felt stable and was splinted at 90 of flexion with the assistance of the [employer]  The left lateral proximal tibia was prepped with Betadine and a 2 mm K wire was placed percutaneously on power through the proximal tibia and out the medial side without difficulty.  A traction bow was placed and 20 pounds of traction were applied with the assistance of the ortho tech.  04/16/2020, 7:35 PM

## 2020-04-16 NOTE — ED Notes (Signed)
8ETT 25 lip at 1939
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
## 2020-04-17 ENCOUNTER — Inpatient Hospital Stay (HOSPITAL_COMMUNITY): Payer: No Typology Code available for payment source

## 2020-04-17 ENCOUNTER — Inpatient Hospital Stay (HOSPITAL_COMMUNITY): Payer: No Typology Code available for payment source | Admitting: Certified Registered"

## 2020-04-17 ENCOUNTER — Encounter (HOSPITAL_COMMUNITY): Payer: Self-pay

## 2020-04-17 ENCOUNTER — Encounter (HOSPITAL_COMMUNITY): Admission: EM | Disposition: A | Discharge status: Home Health | Payer: Self-pay | Source: Home / Self Care

## 2020-04-17 DIAGNOSIS — S53105A Unspecified dislocation of left ulnohumeral joint, initial encounter: Secondary | ICD-10-CM

## 2020-04-17 DIAGNOSIS — S2249XA Multiple fractures of ribs, unspecified side, initial encounter for closed fracture: Secondary | ICD-10-CM

## 2020-04-17 DIAGNOSIS — J939 Pneumothorax, unspecified: Secondary | ICD-10-CM

## 2020-04-17 DIAGNOSIS — S72002A Fracture of unspecified part of neck of left femur, initial encounter for closed fracture: Secondary | ICD-10-CM

## 2020-04-17 DIAGNOSIS — S2500XA Unspecified injury of thoracic aorta, initial encounter: Secondary | ICD-10-CM

## 2020-04-17 DIAGNOSIS — S36039A Unspecified laceration of spleen, initial encounter: Secondary | ICD-10-CM

## 2020-04-17 DIAGNOSIS — S72302A Unspecified fracture of shaft of left femur, initial encounter for closed fracture: Secondary | ICD-10-CM

## 2020-04-17 DIAGNOSIS — S62512A Displaced fracture of proximal phalanx of left thumb, initial encounter for closed fracture: Secondary | ICD-10-CM

## 2020-04-17 DIAGNOSIS — S82002A Unspecified fracture of left patella, initial encounter for closed fracture: Secondary | ICD-10-CM

## 2020-04-17 DIAGNOSIS — S36113A Laceration of liver, unspecified degree, initial encounter: Secondary | ICD-10-CM

## 2020-04-17 DIAGNOSIS — S72462A Displaced supracondylar fracture with intracondylar extension of lower end of left femur, initial encounter for closed fracture: Secondary | ICD-10-CM

## 2020-04-17 HISTORY — DX: Unspecified injury of thoracic aorta, initial encounter: S25.00XA

## 2020-04-17 HISTORY — DX: Pneumothorax, unspecified: J93.9

## 2020-04-17 HISTORY — DX: Unspecified dislocation of left ulnohumeral joint, initial encounter: S53.105A

## 2020-04-17 HISTORY — DX: Unspecified fracture of left patella, initial encounter for closed fracture: S82.002A

## 2020-04-17 HISTORY — DX: Fracture of unspecified part of neck of left femur, initial encounter for closed fracture: S72.002A

## 2020-04-17 HISTORY — PX: OPEN REDUCTION INTERNAL FIXATION (ORIF) METACARPAL: SHX6234

## 2020-04-17 HISTORY — DX: Multiple fractures of ribs, unspecified side, initial encounter for closed fracture: S22.49XA

## 2020-04-17 HISTORY — DX: Unspecified fracture of shaft of left femur, initial encounter for closed fracture: S72.302A

## 2020-04-17 HISTORY — DX: Laceration of liver, unspecified degree, initial encounter: S36.113A

## 2020-04-17 HISTORY — PX: FEMUR IM NAIL: SHX1597

## 2020-04-17 HISTORY — DX: Displaced fracture of proximal phalanx of left thumb, initial encounter for closed fracture: S62.512A

## 2020-04-17 HISTORY — DX: Unspecified laceration of spleen, initial encounter: S36.039A

## 2020-04-17 HISTORY — DX: Displaced supracondylar fracture with intracondylar extension of lower end of left femur, initial encounter for closed fracture: S72.462A

## 2020-04-17 LAB — COMPREHENSIVE METABOLIC PANEL
ALT: 296 U/L — ABNORMAL HIGH (ref 0–44)
AST: 438 U/L — ABNORMAL HIGH (ref 15–41)
Albumin: 2.9 g/dL — ABNORMAL LOW (ref 3.5–5.0)
Alkaline Phosphatase: 43 U/L (ref 38–126)
Anion gap: 11 (ref 5–15)
BUN: 22 mg/dL — ABNORMAL HIGH (ref 6–20)
CO2: 20 mmol/L — ABNORMAL LOW (ref 22–32)
Calcium: 7.4 mg/dL — ABNORMAL LOW (ref 8.9–10.3)
Chloride: 110 mmol/L (ref 98–111)
Creatinine, Ser: 1.67 mg/dL — ABNORMAL HIGH (ref 0.61–1.24)
GFR, Estimated: 56 mL/min — ABNORMAL LOW (ref >60)
Glucose, Bld: 126 mg/dL — ABNORMAL HIGH (ref 70–99)
Potassium: 4.2 mmol/L (ref 3.5–5.1)
Sodium: 141 mmol/L (ref 135–145)
Total Bilirubin: 0.9 mg/dL (ref 0.3–1.2)
Total Protein: 5.2 g/dL — ABNORMAL LOW (ref 6.5–8.1)

## 2020-04-17 LAB — HEMOGLOBIN AND HEMATOCRIT, BLOOD
HCT: 21.5 % — ABNORMAL LOW (ref 39.0–52.0)
Hemoglobin: 7.2 g/dL — ABNORMAL LOW (ref 13.0–17.0)

## 2020-04-17 LAB — POCT I-STAT EG7
Acid-base deficit: 5 mmol/L — ABNORMAL HIGH (ref 0.0–2.0)
Bicarbonate: 21.2 mmol/L (ref 20.0–28.0)
Calcium, Ion: 1.07 mmol/L — ABNORMAL LOW (ref 1.15–1.40)
HCT: 21 % — ABNORMAL LOW (ref 39.0–52.0)
Hemoglobin: 7.1 g/dL — ABNORMAL LOW (ref 13.0–17.0)
O2 Saturation: 94 %
Patient temperature: 35.7
Potassium: 4.7 mmol/L (ref 3.5–5.1)
Sodium: 143 mmol/L (ref 135–145)
TCO2: 23 mmol/L (ref 22–32)
pCO2, Ven: 42.8 mmHg — ABNORMAL LOW (ref 44.0–60.0)
pH, Ven: 7.296 (ref 7.250–7.430)
pO2, Ven: 72 mmHg — ABNORMAL HIGH (ref 32.0–45.0)

## 2020-04-17 LAB — CBC
HCT: 31 % — ABNORMAL LOW (ref 39.0–52.0)
HCT: 27.3 % — ABNORMAL LOW (ref 39.0–52.0)
HCT: 16.5 % — ABNORMAL LOW (ref 39.0–52.0)
Hemoglobin: 10.7 g/dL — ABNORMAL LOW (ref 13.0–17.0)
Hemoglobin: 9.2 g/dL — ABNORMAL LOW (ref 13.0–17.0)
Hemoglobin: 5.4 g/dL — CL (ref 13.0–17.0)
MCH: 32.2 pg (ref 26.0–34.0)
MCH: 32.3 pg (ref 26.0–34.0)
MCH: 32.5 pg (ref 26.0–34.0)
MCHC: 34.5 g/dL (ref 30.0–36.0)
MCHC: 33.7 g/dL (ref 30.0–36.0)
MCHC: 32.7 g/dL (ref 30.0–36.0)
MCV: 93.4 fL (ref 80.0–100.0)
MCV: 95.8 fL (ref 80.0–100.0)
MCV: 99.4 fL (ref 80.0–100.0)
Platelets: 200 10*3/uL (ref 150–400)
Platelets: 161 10*3/uL (ref 150–400)
Platelets: 136 10*3/uL — ABNORMAL LOW (ref 150–400)
RBC: 3.32 MIL/uL — ABNORMAL LOW (ref 4.22–5.81)
RBC: 2.85 MIL/uL — ABNORMAL LOW (ref 4.22–5.81)
RBC: 1.66 MIL/uL — ABNORMAL LOW (ref 4.22–5.81)
RDW: 13.8 % (ref 11.5–15.5)
RDW: 14.1 % (ref 11.5–15.5)
RDW: 13.5 % (ref 11.5–15.5)
WBC: 17.1 10*3/uL — ABNORMAL HIGH (ref 4.0–10.5)
WBC: 11.2 10*3/uL — ABNORMAL HIGH (ref 4.0–10.5)
WBC: 7.9 10*3/uL (ref 4.0–10.5)
nRBC: 0 % (ref 0.0–0.2)
nRBC: 0 % (ref 0.0–0.2)
nRBC: 2 % — ABNORMAL HIGH (ref 0.0–0.2)

## 2020-04-17 LAB — LACTIC ACID, PLASMA: Lactic Acid, Venous: 2.5 mmol/L (ref 0.5–1.9)

## 2020-04-17 LAB — POCT I-STAT, CHEM 8
BUN: 33 mg/dL — ABNORMAL HIGH (ref 6–20)
Calcium, Ion: 1.01 mmol/L — ABNORMAL LOW (ref 1.15–1.40)
Chloride: 111 mmol/L (ref 98–111)
Creatinine, Ser: 1.5 mg/dL — ABNORMAL HIGH (ref 0.61–1.24)
Glucose, Bld: 150 mg/dL — ABNORMAL HIGH (ref 70–99)
HCT: 19 % — ABNORMAL LOW (ref 39.0–52.0)
Hemoglobin: 6.5 g/dL — CL (ref 13.0–17.0)
Potassium: 6.3 mmol/L (ref 3.5–5.1)
Sodium: 141 mmol/L (ref 135–145)
TCO2: 22 mmol/L (ref 22–32)

## 2020-04-17 LAB — PREPARE RBC (CROSSMATCH)

## 2020-04-17 LAB — ABO/RH: ABO/RH(D): O POS

## 2020-04-17 LAB — HIV ANTIBODY (ROUTINE TESTING W REFLEX): HIV Screen 4th Generation wRfx: NONREACTIVE

## 2020-04-17 LAB — TRIGLYCERIDES: Triglycerides: 271 mg/dL — ABNORMAL HIGH (ref <150)

## 2020-04-17 SURGERY — INSERTION, INTRAMEDULLARY ROD, FEMUR, RETROGRADE
Anesthesia: General | Site: Thumb | Laterality: Left

## 2020-04-17 MED ORDER — CEFAZOLIN SODIUM-DEXTROSE 2-4 GM/100ML-% IV SOLN
2.0000 g | Freq: Three times a day (TID) | INTRAVENOUS | Status: AC
  Administered 2020-04-18 (×3): 2 g via INTRAVENOUS
  Filled 2020-04-17 (×4): qty 100

## 2020-04-17 MED ORDER — ONDANSETRON HCL 4 MG/2ML IJ SOLN
INTRAMUSCULAR | Status: AC
  Filled 2020-04-17: qty 2

## 2020-04-17 MED ORDER — FENTANYL CITRATE (PF) 250 MCG/5ML IJ SOLN
INTRAMUSCULAR | Status: AC
  Filled 2020-04-17: qty 5

## 2020-04-17 MED ORDER — ALBUMIN HUMAN 5 % IV SOLN: INTRAVENOUS | Status: DC | PRN

## 2020-04-17 MED ORDER — PROPOFOL 1000 MG/100ML IV EMUL
INTRAVENOUS | Status: AC
  Filled 2020-04-17: qty 100

## 2020-04-17 MED ORDER — LIDOCAINE 2% (20 MG/ML) 5 ML SYRINGE
INTRAMUSCULAR | Status: AC
  Filled 2020-04-17: qty 5

## 2020-04-17 MED ORDER — ROCURONIUM BROMIDE 10 MG/ML (PF) SYRINGE
PREFILLED_SYRINGE | INTRAVENOUS | Status: DC | PRN
  Administered 2020-04-17: 100 mg via INTRAVENOUS
  Administered 2020-04-17: 50 mg via INTRAVENOUS
  Administered 2020-04-17: 20 mg via INTRAVENOUS
  Administered 2020-04-17: 50 mg via INTRAVENOUS

## 2020-04-17 MED ORDER — SODIUM CHLORIDE 0.9% IV SOLUTION: Freq: Once | INTRAVENOUS | Status: AC

## 2020-04-17 MED ORDER — MIDAZOLAM HCL 2 MG/2ML IJ SOLN
INTRAMUSCULAR | Status: AC
  Filled 2020-04-17: qty 2

## 2020-04-17 MED ORDER — CEFAZOLIN SODIUM-DEXTROSE 2-4 GM/100ML-% IV SOLN
INTRAVENOUS | Status: AC
  Filled 2020-04-17: qty 100

## 2020-04-17 MED ORDER — PHENYLEPHRINE 40 MCG/ML (10ML) SYRINGE FOR IV PUSH (FOR BLOOD PRESSURE SUPPORT)
PREFILLED_SYRINGE | INTRAVENOUS | Status: AC
  Filled 2020-04-17: qty 10

## 2020-04-17 MED ORDER — CHLORHEXIDINE GLUCONATE CLOTH 2 % EX PADS
6.0000 | MEDICATED_PAD | Freq: Every day | CUTANEOUS | Status: DC
  Administered 2020-04-17 – 2020-04-24 (×5): 6 via TOPICAL

## 2020-04-17 MED ORDER — ALBUMIN HUMAN 5 % IV SOLN
25.0000 g | Freq: Once | INTRAVENOUS | Status: AC
  Administered 2020-04-17: 25 g via INTRAVENOUS
  Filled 2020-04-17: qty 500

## 2020-04-17 MED ORDER — VANCOMYCIN HCL 1000 MG IV SOLR
INTRAVENOUS | Status: AC
  Filled 2020-04-17: qty 1000

## 2020-04-17 MED ORDER — ROCURONIUM BROMIDE 10 MG/ML (PF) SYRINGE
PREFILLED_SYRINGE | INTRAVENOUS | Status: AC
  Filled 2020-04-17: qty 10

## 2020-04-17 MED ORDER — PROPOFOL 500 MG/50ML IV EMUL
INTRAVENOUS | Status: DC | PRN
  Administered 2020-04-17: 50 ug/kg/min via INTRAVENOUS

## 2020-04-17 MED ORDER — CEFAZOLIN SODIUM-DEXTROSE 2-3 GM-%(50ML) IV SOLR
INTRAVENOUS | Status: DC | PRN
  Administered 2020-04-17 (×2): 2 g via INTRAVENOUS

## 2020-04-17 MED ORDER — LACTATED RINGERS IV SOLN: INTRAVENOUS | Status: DC | PRN

## 2020-04-17 MED ORDER — FENTANYL CITRATE (PF) 250 MCG/5ML IJ SOLN
INTRAMUSCULAR | Status: DC | PRN
  Administered 2020-04-17 (×2): 50 ug via INTRAVENOUS
  Administered 2020-04-17: 100 ug via INTRAVENOUS
  Administered 2020-04-17 (×2): 50 ug via INTRAVENOUS
  Administered 2020-04-17: 100 ug via INTRAVENOUS
  Administered 2020-04-17 (×2): 50 ug via INTRAVENOUS

## 2020-04-17 MED ORDER — SODIUM CHLORIDE 0.9 % IV SOLN
INTRAVENOUS | Status: DC | PRN
  Administered 2020-04-17: 50 ug/h via INTRAVENOUS

## 2020-04-17 MED ORDER — VANCOMYCIN HCL 1000 MG IV SOLR
INTRAVENOUS | Status: DC | PRN
  Administered 2020-04-17: 1000 mg via TOPICAL

## 2020-04-17 MED ORDER — DEXAMETHASONE SODIUM PHOSPHATE 10 MG/ML IJ SOLN
INTRAMUSCULAR | Status: AC
  Filled 2020-04-17: qty 1

## 2020-04-17 MED ORDER — 0.9 % SODIUM CHLORIDE (POUR BTL) OPTIME
TOPICAL | Status: DC | PRN
  Administered 2020-04-17: 1000 mL

## 2020-04-17 MED ORDER — MIDAZOLAM HCL 5 MG/5ML IJ SOLN
INTRAMUSCULAR | Status: DC | PRN
  Administered 2020-04-17: 2 mg via INTRAVENOUS

## 2020-04-17 MED ORDER — PROPOFOL 10 MG/ML IV BOLUS
INTRAVENOUS | Status: AC
  Filled 2020-04-17: qty 20

## 2020-04-17 MED ORDER — DEXAMETHASONE SODIUM PHOSPHATE 10 MG/ML IJ SOLN
INTRAMUSCULAR | Status: DC | PRN
  Administered 2020-04-17: 10 mg via INTRAVENOUS

## 2020-04-17 SURGICAL SUPPLY — 89 items
ADH SKN CLS APL DERMABOND .7 (GAUZE/BANDAGES/DRESSINGS) ×2
APL PRP STRL LF DISP 70% ISPRP (MISCELLANEOUS) ×2
BIT DRILL 2.8 QC 200 STER (BIT) ×1 IMPLANT
BIT DRILL 4.2 (DRILL) IMPLANT
BIT DRILL CALIBRATED 4.2 (BIT) IMPLANT
BIT DRILL CANN 3.0 QC 215 (BIT) ×1 IMPLANT
BIT DRILL CANN LRG QC 5X300 (BIT) ×1 IMPLANT
BIT DRILL QC SFS 2.8X200 (BIT) ×1 IMPLANT
BIT DRILL SHORT 4.2 (BIT) IMPLANT
BNDG COHESIVE 4X5 TAN STRL (GAUZE/BANDAGES/DRESSINGS) ×3 IMPLANT
BNDG ELASTIC 2X5.8 VLCR STR LF (GAUZE/BANDAGES/DRESSINGS) ×1 IMPLANT
BNDG ELASTIC 3X5.8 VLCR STR LF (GAUZE/BANDAGES/DRESSINGS) ×1 IMPLANT
BNDG ELASTIC 4X5.8 VLCR STR LF (GAUZE/BANDAGES/DRESSINGS) ×4 IMPLANT
BNDG ELASTIC 6X5.8 VLCR STR LF (GAUZE/BANDAGES/DRESSINGS) ×3 IMPLANT
BRUSH SCRUB EZ PLAIN DRY (MISCELLANEOUS) ×6 IMPLANT
CHLORAPREP W/TINT 26 (MISCELLANEOUS) ×3 IMPLANT
COVER MAYO STAND STRL (DRAPES) ×3 IMPLANT
COVER SURGICAL LIGHT HANDLE (MISCELLANEOUS) ×6 IMPLANT
COVER WAND RF STERILE (DRAPES) ×3 IMPLANT
DERMABOND ADVANCED (GAUZE/BANDAGES/DRESSINGS) ×1
DERMABOND ADVANCED .7 DNX12 (GAUZE/BANDAGES/DRESSINGS) ×2 IMPLANT
DRAPE C-ARM 42X72 X-RAY (DRAPES) ×3 IMPLANT
DRAPE C-ARMOR (DRAPES) ×3 IMPLANT
DRAPE EXTREMITY T 121X128X90 (DISPOSABLE) ×1 IMPLANT
DRAPE HALF SHEET 40X57 (DRAPES) ×6 IMPLANT
DRAPE IMP U-DRAPE 54X76 (DRAPES) ×6 IMPLANT
DRAPE INCISE IOBAN 66X45 STRL (DRAPES) ×4 IMPLANT
DRAPE ORTHO SPLIT 77X108 STRL (DRAPES) ×6
DRAPE SURG 17X23 STRL (DRAPES) ×3 IMPLANT
DRAPE SURG ORHT 6 SPLT 77X108 (DRAPES) ×4 IMPLANT
DRAPE U-SHAPE 47X51 STRL (DRAPES) ×3 IMPLANT
DRILL 4.2 (DRILL) ×6
DRILL BIT CALIBRATED 4.2 (BIT) ×3
DRILL BIT SHORT 4.2 (BIT) ×6
DRSG MEPILEX BORDER 4X4 (GAUZE/BANDAGES/DRESSINGS) ×3 IMPLANT
DRSG MEPILEX BORDER 4X8 (GAUZE/BANDAGES/DRESSINGS) ×1 IMPLANT
ELECT REM PT RETURN 9FT ADLT (ELECTROSURGICAL) ×3
ELECTRODE REM PT RTRN 9FT ADLT (ELECTROSURGICAL) ×2 IMPLANT
GAUZE SPONGE 4X4 12PLY STRL (GAUZE/BANDAGES/DRESSINGS) ×3 IMPLANT
GLOVE BIO SURGEON STRL SZ 6.5 (GLOVE) ×9 IMPLANT
GLOVE BIO SURGEON STRL SZ7.5 (GLOVE) ×12 IMPLANT
GLOVE BIOGEL PI IND STRL 7.5 (GLOVE) ×2 IMPLANT
GLOVE BIOGEL PI INDICATOR 7.5 (GLOVE) ×1
GLOVE SURG UNDER POLY LF SZ6.5 (GLOVE) ×3 IMPLANT
GOWN STRL REUS W/ TWL LRG LVL3 (GOWN DISPOSABLE) ×4 IMPLANT
GOWN STRL REUS W/TWL LRG LVL3 (GOWN DISPOSABLE) ×6
GUIDEWIRE 1.6X220 TOCAR TIP (WIRE) ×5 IMPLANT
GUIDEWIRE 3.2X400 (WIRE) ×2 IMPLANT
GUIDEWIRE THREADED 2.8 (WIRE) ×3 IMPLANT
K-WIRE PLA 9 .062 (WIRE) ×2 IMPLANT
KIT BASIN OR (CUSTOM PROCEDURE TRAY) ×3 IMPLANT
KIT TURNOVER KIT B (KITS) ×3 IMPLANT
NAIL RFNA BEND 9X360 5D (Nail) ×1 IMPLANT
PACK ORTHO EXTREMITY (CUSTOM PROCEDURE TRAY) ×3 IMPLANT
PAD ARMBOARD 7.5X6 YLW CONV (MISCELLANEOUS) ×6 IMPLANT
PAD CAST 3X4 CTTN HI CHSV (CAST SUPPLIES) IMPLANT
PAD CAST 4YDX4 CTTN HI CHSV (CAST SUPPLIES) IMPLANT
PADDING CAST ABS 4INX4YD NS (CAST SUPPLIES) ×2
PADDING CAST ABS COTTON 4X4 ST (CAST SUPPLIES) IMPLANT
PADDING CAST COTTON 3X4 STRL (CAST SUPPLIES) ×3
PADDING CAST COTTON 4X4 STRL (CAST SUPPLIES) ×3
REAMER ROD DEEP FLUTE 2.5X950 (INSTRUMENTS) ×1 IMPLANT
SCREW CANN 16 THRD/85 6.5 (Screw) ×1 IMPLANT
SCREW CANN 6.5X75MM (Screw) ×1 IMPLANT
SCREW CANN 6.5X80MM (Screw) ×1 IMPLANT
SCREW CANN 6.5X95 (Screw) ×1 IMPLANT
SCREW CANN HDLS 4.5X75 (Screw) ×1 IMPLANT
SCREW CANN HDLS LNG 4.5X70 (Screw) ×2 IMPLANT
SCREW LOCK IM 5X36 (Screw) ×3 IMPLANT
SCREW LOCK IM 5X86 (Screw) ×1 IMPLANT
SCREW LOCK IM NL 5X38 (Screw) ×1 IMPLANT
SCREW LOCK IM NL 5X64 (Screw) ×1 IMPLANT
SCREW LOCK IM NL 5X74 (Screw) ×1 IMPLANT
SCREW LOCK IM NL 5X76 (Screw) ×1 IMPLANT
SCREW LOCK X25 36X5X IM NL (Screw) IMPLANT
SCREW SHANZ 5.0X175MM (Screw) ×2 IMPLANT
SLING ARM FOAM STRAP XLG (SOFTGOODS) ×1 IMPLANT
SPONGE LAP 18X18 RF (DISPOSABLE) ×3 IMPLANT
STAPLER VISISTAT 35W (STAPLE) ×3 IMPLANT
SUT MNCRL AB 3-0 PS2 18 (SUTURE) ×7 IMPLANT
SUT VIC AB 0 CT1 27 (SUTURE)
SUT VIC AB 0 CT1 27XBRD ANBCTR (SUTURE) IMPLANT
SUT VIC AB 2-0 CT1 27 (SUTURE)
SUT VIC AB 2-0 CT1 TAPERPNT 27 (SUTURE) IMPLANT
TOWEL GREEN STERILE (TOWEL DISPOSABLE) ×6 IMPLANT
TOWEL GREEN STERILE FF (TOWEL DISPOSABLE) ×3 IMPLANT
TUBE CONNECTING 12X1/4 (SUCTIONS) ×3 IMPLANT
WASHER FOR 5.0 SCREWS (Washer) ×1 IMPLANT
YANKAUER SUCT BULB TIP NO VENT (SUCTIONS) ×3 IMPLANT
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
## 2020-04-17 NOTE — ED Notes (Signed)
Wife Cheri Guppy number given to [facility] [phone]

## 2020-04-17 NOTE — Progress Notes (Signed)
CRITICAL VALUE ALERT  Critical Value:  Hgb 5.4  Date & Time Notied:  04/17/20 2200  Provider Notified: MD Trauma Dr Freida Busman  Orders Received/Actions taken: Awaiting orders for 2 units transfusion

## 2020-04-17 NOTE — ED Notes (Signed)
Patient status unchanged bilateral pedal pulses presents good cap refill.

## 2020-04-17 NOTE — ED Notes (Signed)
Dr Janee Morn rounding.

## 2020-04-17 NOTE — Progress Notes (Signed)
RT obtained post intubation ABG w/the following results. RT will continue to monitor.   Results for Nicholas Lopez, Nicholas Lopez (MRN [id number]) as of 04/17/2020 04:50  Ref. Range 04/16/2020 21:09  Sample type Unknown ARTERIAL  pH, Arterial Latest Ref Range: 7.350 - 7.450  7.328 (L)  pCO2 arterial Latest Ref Range: 32.0 - 48.0 mmHg 45.9  pO2, Arterial Latest Ref Range: 83.0 - 108.0 mmHg 342 (H)  TCO2 Latest Ref Range: 22 - 32 mmol/L 26  Acid-base deficit Latest Ref Range: 0.0 - 2.0 mmol/L 2.0  Bicarbonate Latest Ref Range: 20.0 - 28.0 mmol/L 24.5  O2 Saturation Latest Units: % 100.0  Patient temperature Unknown 96.1 F  Collection site Unknown Brachial

## 2020-04-17 NOTE — ED Notes (Signed)
Dr Jena Gauss at bedside rounding .

## 2020-04-17 NOTE — ED Notes (Signed)
Dr Jena Gauss spoke with wife on the phone and update given.
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
## 2020-04-17 NOTE — ED Notes (Signed)
Dr Randie Heinz and PA at bedside.

## 2020-04-17 NOTE — Progress Notes (Signed)
Patient ID: Nicholas Lopez, male   DOB: Sep 17, 1990, 30 y.o.   MRN: [id number] I reviewed his CT chest with Dr. Dorris Fetch from [facility] regarding the L rib FXs with a fragment poking the lung. He thinks it will scar down without needing to be plated. If the patient's air leak persists, he will re-eval at that time. Currently his air leak is intermittent.  Violeta Gelinas, MD, MPH, FACS Please use [email or web address] to contact on call provider

## 2020-04-17 NOTE — Progress Notes (Signed)
Patient arrived from PACU; bilat chest tubes placed to suction. Only belongings at this time include black rubber bracelet and earring from right ear, both removed and placed in container placed at bedside. Report given to nightshift RN.

## 2020-04-17 NOTE — Anesthesia Procedure Notes (Signed)
Date/Time: 04/17/2020 12:24 PM Performed by: Lucinda Dell, CRNA Pre-anesthesia Checklist: Patient identified, Emergency Drugs available, Suction available and Patient being monitored Patient Re-evaluated:Patient Re-evaluated prior to induction Oxygen Delivery Method: Circle system utilized Induction Type: Inhalational induction with existing ETT Placement Confirmation: positive ETCO2 and breath sounds checked- equal and bilateral
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
## 2020-04-17 NOTE — ED Notes (Signed)
Attempted to call wife x 2 no answer message left

## 2020-04-17 NOTE — Transfer of Care (Signed)
Immediate Anesthesia Transfer of Care Note  Patient: Nicholas Lopez  Procedure(s) Performed: INTRAMEDULLARY (IM) RETROGRADE FEMORAL NAILING (Left Leg Upper) OPEN REDUCTION INTERNAL FIXATION (ORIF) METACARPAL (Left Thumb)  Patient Location: PACU  Anesthesia Type:General  Level of Consciousness: sedated, unresponsive and Patient remains intubated per anesthesia plan  Airway & Oxygen Therapy: Patient remains intubated per anesthesia plan and Patient placed on Ventilator (see vital sign flow sheet for setting)  Post-op Assessment: Report given to RN and Post -op Vital signs reviewed and stable  Post vital signs: Reviewed and stable  Last Vitals:  Vitals Value Taken Time  BP 111/69 04/17/20 1713  Temp    Pulse 110 04/17/20 1715  Resp 18 04/17/20 1715  SpO2 97 % 04/17/20 1715  Vitals shown include unvalidated device data.  Last Pain:  Vitals:   04/17/20 0727  TempSrc: Temporal  PainSc:          Complications: No complications documented.

## 2020-04-18 ENCOUNTER — Inpatient Hospital Stay (HOSPITAL_COMMUNITY): Payer: No Typology Code available for payment source | Admitting: Certified Registered Nurse Anesthetist

## 2020-04-18 ENCOUNTER — Inpatient Hospital Stay (HOSPITAL_COMMUNITY): Payer: No Typology Code available for payment source

## 2020-04-18 LAB — POCT I-STAT 7, (LYTES, BLD GAS, ICA,H+H)
Acid-Base Excess: 2 mmol/L (ref 0.0–2.0)
Bicarbonate: 27.7 mmol/L (ref 20.0–28.0)
Calcium, Ion: 1.09 mmol/L — ABNORMAL LOW (ref 1.15–1.40)
HCT: 20 % — ABNORMAL LOW (ref 39.0–52.0)
Hemoglobin: 6.8 g/dL — CL (ref 13.0–17.0)
O2 Saturation: 99 %
Patient temperature: 98.6
Potassium: 3.6 mmol/L (ref 3.5–5.1)
Sodium: 144 mmol/L (ref 135–145)
TCO2: 29 mmol/L (ref 22–32)
pCO2 arterial: 48.5 mmHg — ABNORMAL HIGH (ref 32.0–48.0)
pH, Arterial: 7.365 (ref 7.350–7.450)
pO2, Arterial: 151 mmHg — ABNORMAL HIGH (ref 83.0–108.0)

## 2020-04-18 LAB — BASIC METABOLIC PANEL
Anion gap: 9 (ref 5–15)
BUN: 15 mg/dL (ref 6–20)
CO2: 21 mmol/L — ABNORMAL LOW (ref 22–32)
Calcium: 7.7 mg/dL — ABNORMAL LOW (ref 8.9–10.3)
Chloride: 111 mmol/L (ref 98–111)
Creatinine, Ser: 1.09 mg/dL (ref 0.61–1.24)
GFR, Estimated: >60 mL/min (ref >60)
Glucose, Bld: 154 mg/dL — ABNORMAL HIGH (ref 70–99)
Potassium: 4.2 mmol/L (ref 3.5–5.1)
Sodium: 141 mmol/L (ref 135–145)

## 2020-04-18 LAB — CBC
HCT: 23.1 % — ABNORMAL LOW (ref 39.0–52.0)
Hemoglobin: 7.5 g/dL — ABNORMAL LOW (ref 13.0–17.0)
MCH: 31 pg (ref 26.0–34.0)
MCHC: 32.5 g/dL (ref 30.0–36.0)
MCV: 95.5 fL (ref 80.0–100.0)
Platelets: 116 10*3/uL — ABNORMAL LOW (ref 150–400)
RBC: 2.42 MIL/uL — ABNORMAL LOW (ref 4.22–5.81)
RDW: 13.8 % (ref 11.5–15.5)
WBC: 13.2 10*3/uL — ABNORMAL HIGH (ref 4.0–10.5)
nRBC: 0 % (ref 0.0–0.2)

## 2020-04-18 LAB — VITAMIN D 25 HYDROXY (VIT D DEFICIENCY, FRACTURES): Vit D, 25-Hydroxy: 16.99 ng/mL — ABNORMAL LOW (ref 30–100)

## 2020-04-18 LAB — PREPARE RBC (CROSSMATCH)

## 2020-04-18 MED ORDER — PROPOFOL 10 MG/ML IV BOLUS
INTRAVENOUS | Status: DC | PRN
  Administered 2020-04-18: 170 mg via INTRAVENOUS

## 2020-04-18 MED ORDER — HYDROMORPHONE HCL 1 MG/ML IJ SOLN
0.5000 mg | INTRAMUSCULAR | Status: DC | PRN
  Administered 2020-04-18 – 2020-04-21 (×4): 1 mg via INTRAVENOUS
  Filled 2020-04-18 (×5): qty 1

## 2020-04-18 MED ORDER — OXYCODONE HCL 5 MG PO TABS: 5.0000 mg | ORAL_TABLET | ORAL | Status: DC | PRN

## 2020-04-18 MED ORDER — SODIUM CHLORIDE 0.9 % IV SOLN
INTRAVENOUS | Status: DC
  Administered 2020-04-18: 10 mL/h via INTRAVENOUS

## 2020-04-18 MED ORDER — ONDANSETRON HCL 4 MG/2ML IJ SOLN: 4.0000 mg | Freq: Four times a day (QID) | INTRAMUSCULAR | Status: DC | PRN

## 2020-04-18 MED ORDER — FENTANYL CITRATE (PF) 100 MCG/2ML IJ SOLN
50.0000 ug | Freq: Once | INTRAMUSCULAR | Status: AC
  Administered 2020-04-18: 50 ug via INTRAVENOUS

## 2020-04-18 MED ORDER — PROPOFOL 1000 MG/100ML IV EMUL
0.0000 ug/kg/min | INTRAVENOUS | Status: DC
  Administered 2020-04-18: 40 ug/kg/min via INTRAVENOUS
  Administered 2020-04-18: 20 ug/kg/min via INTRAVENOUS
  Administered 2020-04-19: 40 ug/kg/min via INTRAVENOUS
  Administered 2020-04-19: 50 ug/kg/min via INTRAVENOUS
  Administered 2020-04-19 – 2020-04-20 (×3): 30 ug/kg/min via INTRAVENOUS
  Administered 2020-04-20: 25 ug/kg/min via INTRAVENOUS
  Administered 2020-04-21: 30 ug/kg/min via INTRAVENOUS
  Filled 2020-04-18 (×10): qty 100

## 2020-04-18 MED ORDER — ORAL CARE MOUTH RINSE
15.0000 mL | OROMUCOSAL | Status: DC
  Administered 2020-04-18 – 2020-04-22 (×39): 15 mL via OROMUCOSAL

## 2020-04-18 MED ORDER — FENTANYL 2500MCG IN NS 250ML (10MCG/ML) PREMIX INFUSION
50.0000 ug/h | INTRAVENOUS | Status: DC
  Administered 2020-04-19: 200 ug/h via INTRAVENOUS
  Administered 2020-04-19: 150 ug/h via INTRAVENOUS
  Administered 2020-04-20: 200 ug/h via INTRAVENOUS
  Administered 2020-04-20: 125 ug/h via INTRAVENOUS
  Administered 2020-04-21: 200 ug/h via INTRAVENOUS
  Filled 2020-04-18 (×5): qty 250

## 2020-04-18 MED ORDER — LIDOCAINE HCL (CARDIAC) PF 100 MG/5ML IV SOSY
PREFILLED_SYRINGE | INTRAVENOUS | Status: DC | PRN
  Administered 2020-04-18: 40 mg via INTRAVENOUS

## 2020-04-18 MED ORDER — ROCURONIUM BROMIDE 100 MG/10ML IV SOLN
INTRAVENOUS | Status: DC | PRN
  Administered 2020-04-18: 100 mg via INTRAVENOUS

## 2020-04-18 MED ORDER — CHLORHEXIDINE GLUCONATE 0.12% ORAL RINSE (MEDLINE KIT)
15.0000 mL | Freq: Two times a day (BID) | OROMUCOSAL | Status: DC
  Administered 2020-04-18 – 2020-04-21 (×9): 15 mL via OROMUCOSAL

## 2020-04-18 MED ORDER — SODIUM CHLORIDE 0.9% IV SOLUTION: Freq: Once | INTRAVENOUS | Status: AC

## 2020-04-18 MED ORDER — ACETAMINOPHEN 500 MG PO TABS: 1000.0000 mg | ORAL_TABLET | Freq: Four times a day (QID) | ORAL | Status: DC

## 2020-04-18 MED ORDER — ACETAMINOPHEN 500 MG PO TABS
1000.0000 mg | ORAL_TABLET | Freq: Four times a day (QID) | ORAL | Status: DC
  Administered 2020-04-18 – 2020-04-21 (×8): 1000 mg
  Filled 2020-04-18 (×8): qty 2

## 2020-04-18 MED ORDER — FENTANYL BOLUS VIA INFUSION
50.0000 ug | INTRAVENOUS | Status: DC | PRN
  Filled 2020-04-18: qty 50

## 2020-04-18 MED ORDER — GABAPENTIN 250 MG/5ML PO SOLN
300.0000 mg | Freq: Three times a day (TID) | ORAL | Status: DC
  Administered 2020-04-18 – 2020-04-21 (×7): 300 mg
  Filled 2020-04-18 (×10): qty 6

## 2020-04-18 MED ORDER — METHOCARBAMOL 1000 MG/10ML IJ SOLN
500.0000 mg | Freq: Four times a day (QID) | INTRAVENOUS | Status: DC | PRN
  Filled 2020-04-18 (×2): qty 5

## 2020-04-18 MED ORDER — GABAPENTIN 300 MG PO CAPS: 300.0000 mg | ORAL_CAPSULE | Freq: Three times a day (TID) | ORAL | Status: DC

## 2020-04-18 NOTE — Anesthesia Procedure Notes (Signed)
Procedure Name: Intubation Date/Time: 04/18/2020 3:10 PM Performed by: Dairl Ponder, CRNA Pre-anesthesia Checklist: Patient identified, Emergency Drugs available, Suction available, Patient being monitored and Timeout performed Patient Re-evaluated:Patient Re-evaluated prior to induction Oxygen Delivery Method: Circle system utilized Preoxygenation: Pre-oxygenation with 100% oxygen Induction Type: IV induction Ventilation: Mask ventilation without difficulty Laryngoscope Size: Glidescope and 4 Grade View: Grade I Tube type: Oral Tube size: 7.5 mm Number of attempts: 1 Airway Equipment and Method: Video-laryngoscopy and Stylet Placement Confirmation: ETT inserted through vocal cords under direct vision,  CO2 detector and breath sounds checked- equal and bilateral Secured at: 23 cm Tube secured with: Tape Dental Injury: Teeth and Oropharynx as per pre-operative assessment

## 2020-04-18 NOTE — Anesthesia Postprocedure Evaluation (Signed)
Anesthesia Post Note  Patient: Nicholas Lopez  Procedure(s) Performed: INTRAMEDULLARY (IM) RETROGRADE FEMORAL NAILING (Left Leg Upper) OPEN REDUCTION INTERNAL FIXATION (ORIF) METACARPAL (Left Thumb)     Patient location during evaluation: PACU Anesthesia Type: General Level of consciousness: sedated and patient remains intubated per anesthesia plan Pain management: pain level controlled Vital Signs Assessment: post-procedure vital signs reviewed and stable Respiratory status: patient remains intubated per anesthesia plan Cardiovascular status: stable Postop Assessment: no apparent nausea or vomiting Anesthetic complications: no   No complications documented.  Last Vitals:  Vitals:   04/18/20 0804 04/18/20 0820  BP:  116/63  Pulse: (!) 112 (!) 118  Resp: 18 12  Temp:    SpO2:      Last Pain:  Vitals:   04/18/20 0400  TempSrc: Oral  PainSc:                  Beryle Lathe
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
## 2020-04-18 NOTE — Progress Notes (Signed)
When extubated, patient extremely diaphoretic, RR 30-40, HR 140-150s, and requiring NRB mask at 15L. Dr. Fredricka Bonine notified and came to bedside. Received order for stat CXR and prn medication. Will monitor closely.
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
## 2020-04-18 NOTE — Progress Notes (Signed)
Patient mother updated with translator and came to bedside at patient's request.

## 2020-04-18 NOTE — Progress Notes (Signed)
Earring and bracelet given to patient's brother and taken home.

## 2020-04-18 NOTE — Progress Notes (Signed)
Patient extubated to 4lnc. Patient immediately decompensated and had to be placed on 100% NRB. Patient had increased HR to 150's, increased WOB, increased RR. Dr. Fredricka Bonine currently at bedside. RT will continue to monitor.

## 2020-04-18 NOTE — Transfer of Care (Signed)
Immediate Anesthesia Transfer of Care Note  Patient: Nicholas Lopez  Procedure(s) Performed: AN AD HOC INTUBATION  Patient Location: ICU  Anesthesia Type:General  Level of Consciousness: Patient remains intubated per anesthesia plan  Airway & Oxygen Therapy: Patient remains intubated per anesthesia plan and Patient placed on Ventilator (see vital sign flow sheet for setting)  Post-op Assessment: Report given to RN and Post -op Vital signs reviewed and stable  Post vital signs: Reviewed and stable  Last Vitals:  Vitals Value Taken Time  BP 148/78 04/18/20 1518  Temp    Pulse 144 04/18/20 1524  Resp 18 04/18/20 1524  SpO2 98 % 04/18/20 1524  Vitals shown include unvalidated device data.  Last Pain:  Vitals:   04/18/20 1200  TempSrc: Oral  PainSc:          Complications: No complications documented.

## 2020-04-18 NOTE — Progress Notes (Signed)
Patient restless and repeatedly removing oxygen, resulting in O2 sats in 70s. Patient has weak cough when prompted to cough and deep breath. Increasing oxygen demands and dyspnea.Dr. Fredricka Bonine notified and anesthesia paged for intubation

## 2020-04-19 ENCOUNTER — Inpatient Hospital Stay (HOSPITAL_COMMUNITY): Payer: No Typology Code available for payment source

## 2020-04-19 LAB — TYPE AND SCREEN
ABO/RH(D): O POS
Antibody Screen: NEGATIVE
Unit division: 0
Unit division: 0

## 2020-04-19 LAB — CBC
HCT: 21.8 % — ABNORMAL LOW (ref 39.0–52.0)
Hemoglobin: 7.1 g/dL — ABNORMAL LOW (ref 13.0–17.0)
MCH: 30.9 pg (ref 26.0–34.0)
MCHC: 32.6 g/dL (ref 30.0–36.0)
MCV: 94.8 fL (ref 80.0–100.0)
Platelets: 102 10*3/uL — ABNORMAL LOW (ref 150–400)
RBC: 2.3 MIL/uL — ABNORMAL LOW (ref 4.22–5.81)
RDW: 14.7 % (ref 11.5–15.5)
WBC: 11.2 10*3/uL — ABNORMAL HIGH (ref 4.0–10.5)
nRBC: 0 % (ref 0.0–0.2)

## 2020-04-19 LAB — BPAM RBC
Blood Product Expiration Date: 202202232359
Blood Product Expiration Date: 202202232359
ISSUE DATE / TIME: 202201212245
ISSUE DATE / TIME: 202201221713
Unit Type and Rh: 5100
Unit Type and Rh: 5100

## 2020-04-19 LAB — POCT I-STAT 7, (LYTES, BLD GAS, ICA,H+H)
Acid-Base Excess: 3 mmol/L — ABNORMAL HIGH (ref 0.0–2.0)
Bicarbonate: 28.7 mmol/L — ABNORMAL HIGH (ref 20.0–28.0)
Calcium, Ion: 1.17 mmol/L (ref 1.15–1.40)
HCT: 23 % — ABNORMAL LOW (ref 39.0–52.0)
Hemoglobin: 7.8 g/dL — ABNORMAL LOW (ref 13.0–17.0)
O2 Saturation: 100 %
Patient temperature: 98.6
Potassium: 3.7 mmol/L (ref 3.5–5.1)
Sodium: 143 mmol/L (ref 135–145)
TCO2: 30 mmol/L (ref 22–32)
pCO2 arterial: 48.4 mmHg — ABNORMAL HIGH (ref 32.0–48.0)
pH, Arterial: 7.38 (ref 7.350–7.450)
pO2, Arterial: 212 mmHg — ABNORMAL HIGH (ref 83.0–108.0)

## 2020-04-19 LAB — BASIC METABOLIC PANEL
Anion gap: 7 (ref 5–15)
BUN: 9 mg/dL (ref 6–20)
CO2: 26 mmol/L (ref 22–32)
Calcium: 8 mg/dL — ABNORMAL LOW (ref 8.9–10.3)
Chloride: 109 mmol/L (ref 98–111)
Creatinine, Ser: 0.74 mg/dL (ref 0.61–1.24)
GFR, Estimated: >60 mL/min (ref >60)
Glucose, Bld: 100 mg/dL — ABNORMAL HIGH (ref 70–99)
Potassium: 3.6 mmol/L (ref 3.5–5.1)
Sodium: 142 mmol/L (ref 135–145)

## 2020-04-19 LAB — MAGNESIUM: Magnesium: 2.2 mg/dL (ref 1.7–2.4)

## 2020-04-19 MED ORDER — POTASSIUM CHLORIDE 10 MEQ/100ML IV SOLN
10.0000 meq | INTRAVENOUS | Status: AC
  Administered 2020-04-19 (×4): 10 meq via INTRAVENOUS
  Filled 2020-04-19 (×4): qty 100

## 2020-04-19 MED ORDER — IOHEXOL 350 MG/ML SOLN
100.0000 mL | Freq: Once | INTRAVENOUS | Status: AC | PRN
  Administered 2020-04-19: 100 mL via INTRAVENOUS
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
## 2020-04-20 ENCOUNTER — Encounter (HOSPITAL_COMMUNITY): Payer: Self-pay | Admitting: Student

## 2020-04-20 ENCOUNTER — Inpatient Hospital Stay (HOSPITAL_COMMUNITY): Payer: No Typology Code available for payment source

## 2020-04-20 LAB — BASIC METABOLIC PANEL
Anion gap: 10 (ref 5–15)
BUN: 8 mg/dL (ref 6–20)
CO2: 25 mmol/L (ref 22–32)
Calcium: 8.2 mg/dL — ABNORMAL LOW (ref 8.9–10.3)
Chloride: 106 mmol/L (ref 98–111)
Creatinine, Ser: 0.79 mg/dL (ref 0.61–1.24)
GFR, Estimated: >60 mL/min (ref >60)
Glucose, Bld: 77 mg/dL (ref 70–99)
Potassium: 4.8 mmol/L (ref 3.5–5.1)
Sodium: 141 mmol/L (ref 135–145)

## 2020-04-20 LAB — TRIGLYCERIDES: Triglycerides: 327 mg/dL — ABNORMAL HIGH (ref <150)

## 2020-04-20 LAB — CBC
HCT: 22.7 % — ABNORMAL LOW (ref 39.0–52.0)
Hemoglobin: 8 g/dL — ABNORMAL LOW (ref 13.0–17.0)
MCH: 32.4 pg (ref 26.0–34.0)
MCHC: 35.2 g/dL (ref 30.0–36.0)
MCV: 91.9 fL (ref 80.0–100.0)
Platelets: 130 10*3/uL — ABNORMAL LOW (ref 150–400)
RBC: 2.47 MIL/uL — ABNORMAL LOW (ref 4.22–5.81)
RDW: 13.8 % (ref 11.5–15.5)
WBC: 12.8 10*3/uL — ABNORMAL HIGH (ref 4.0–10.5)
nRBC: 0 % (ref 0.0–0.2)

## 2020-04-20 LAB — GLUCOSE, CAPILLARY
Glucose-Capillary: 83 mg/dL (ref 70–99)
Glucose-Capillary: 77 mg/dL (ref 70–99)
Glucose-Capillary: 90 mg/dL (ref 70–99)
Glucose-Capillary: 101 mg/dL — ABNORMAL HIGH (ref 70–99)

## 2020-04-20 LAB — PHOSPHORUS
Phosphorus: 3.6 mg/dL (ref 2.5–4.6)
Phosphorus: 3.1 mg/dL (ref 2.5–4.6)

## 2020-04-20 LAB — MAGNESIUM
Magnesium: 1.5 mg/dL — ABNORMAL LOW (ref 1.7–2.4)
Magnesium: 2.9 mg/dL — ABNORMAL HIGH (ref 1.7–2.4)

## 2020-04-20 MED ORDER — OXYCODONE HCL 5 MG PO TABS
5.0000 mg | ORAL_TABLET | ORAL | Status: DC | PRN
  Administered 2020-04-20 (×2): 5 mg
  Filled 2020-04-20 (×2): qty 1

## 2020-04-20 MED ORDER — VITAL HIGH PROTEIN PO LIQD: 1000.0000 mL | ORAL | Status: DC

## 2020-04-20 MED ORDER — VITAMIN D 25 MCG (1000 UNIT) PO TABS
2000.0000 [IU] | ORAL_TABLET | Freq: Two times a day (BID) | ORAL | Status: DC
  Administered 2020-04-20 (×2): 2000 [IU]
  Filled 2020-04-20 (×2): qty 2

## 2020-04-20 MED ORDER — CLONAZEPAM 0.5 MG PO TABS
0.5000 mg | ORAL_TABLET | Freq: Two times a day (BID) | ORAL | Status: DC
  Administered 2020-04-20 – 2020-04-21 (×3): 0.5 mg
  Filled 2020-04-20 (×3): qty 1

## 2020-04-20 MED ORDER — PROSOURCE TF PO LIQD
45.0000 mL | Freq: Two times a day (BID) | ORAL | Status: DC
  Administered 2020-04-20: 45 mL
  Filled 2020-04-20: qty 45

## 2020-04-20 MED ORDER — CEFEPIME HCL 2 G IJ SOLR
2.0000 g | Freq: Three times a day (TID) | INTRAMUSCULAR | Status: DC
  Administered 2020-04-20 – 2020-04-24 (×12): 2 g via INTRAVENOUS
  Filled 2020-04-20 (×11): qty 2

## 2020-04-20 MED ORDER — MAGNESIUM SULFATE 4 GM/100ML IV SOLN
4.0000 g | Freq: Once | INTRAVENOUS | Status: AC
  Administered 2020-04-20: 4 g via INTRAVENOUS
  Filled 2020-04-20: qty 100

## 2020-04-20 MED ORDER — QUETIAPINE FUMARATE 25 MG PO TABS
50.0000 mg | ORAL_TABLET | Freq: Two times a day (BID) | ORAL | Status: DC
  Administered 2020-04-20 (×2): 50 mg
  Filled 2020-04-20 (×2): qty 2

## 2020-04-20 MED ORDER — PIVOT 1.5 CAL PO LIQD
1000.0000 mL | ORAL | Status: DC
  Administered 2020-04-20: 1000 mL
  Filled 2020-04-20: qty 1000
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
## 2020-04-21 ENCOUNTER — Inpatient Hospital Stay (HOSPITAL_COMMUNITY): Payer: No Typology Code available for payment source

## 2020-04-21 LAB — GLUCOSE, CAPILLARY
Glucose-Capillary: 152 mg/dL — ABNORMAL HIGH (ref 70–99)
Glucose-Capillary: 141 mg/dL — ABNORMAL HIGH (ref 70–99)
Glucose-Capillary: 102 mg/dL — ABNORMAL HIGH (ref 70–99)
Glucose-Capillary: 116 mg/dL — ABNORMAL HIGH (ref 70–99)
Glucose-Capillary: 114 mg/dL — ABNORMAL HIGH (ref 70–99)
Glucose-Capillary: 112 mg/dL — ABNORMAL HIGH (ref 70–99)

## 2020-04-21 LAB — PHOSPHORUS
Phosphorus: 3.4 mg/dL (ref 2.5–4.6)
Phosphorus: 3.4 mg/dL (ref 2.5–4.6)

## 2020-04-21 LAB — BASIC METABOLIC PANEL
Anion gap: 11 (ref 5–15)
BUN: 11 mg/dL (ref 6–20)
CO2: 28 mmol/L (ref 22–32)
Calcium: 8.1 mg/dL — ABNORMAL LOW (ref 8.9–10.3)
Chloride: 100 mmol/L (ref 98–111)
Creatinine, Ser: 0.61 mg/dL (ref 0.61–1.24)
GFR, Estimated: >60 mL/min (ref >60)
Glucose, Bld: 178 mg/dL — ABNORMAL HIGH (ref 70–99)
Potassium: 3.1 mmol/L — ABNORMAL LOW (ref 3.5–5.1)
Sodium: 139 mmol/L (ref 135–145)

## 2020-04-21 LAB — CBC
HCT: 22.3 % — ABNORMAL LOW (ref 39.0–52.0)
Hemoglobin: 7.7 g/dL — ABNORMAL LOW (ref 13.0–17.0)
MCH: 31.8 pg (ref 26.0–34.0)
MCHC: 34.5 g/dL (ref 30.0–36.0)
MCV: 92.1 fL (ref 80.0–100.0)
Platelets: 171 10*3/uL (ref 150–400)
RBC: 2.42 MIL/uL — ABNORMAL LOW (ref 4.22–5.81)
RDW: 13.3 % (ref 11.5–15.5)
WBC: 11.5 10*3/uL — ABNORMAL HIGH (ref 4.0–10.5)
nRBC: 0 % (ref 0.0–0.2)

## 2020-04-21 LAB — CULTURE, RESPIRATORY W GRAM STAIN: Special Requests: NORMAL

## 2020-04-21 LAB — MAGNESIUM
Magnesium: 1.9 mg/dL (ref 1.7–2.4)
Magnesium: 1.9 mg/dL (ref 1.7–2.4)

## 2020-04-21 MED ORDER — ENOXAPARIN SODIUM 30 MG/0.3ML ~~LOC~~ SOLN
30.0000 mg | Freq: Two times a day (BID) | SUBCUTANEOUS | Status: DC
  Administered 2020-04-21 – 2020-04-24 (×7): 30 mg via SUBCUTANEOUS
  Filled 2020-04-21 (×7): qty 0.3

## 2020-04-21 MED ORDER — KETOROLAC TROMETHAMINE 15 MG/ML IJ SOLN
15.0000 mg | Freq: Three times a day (TID) | INTRAMUSCULAR | Status: DC
  Administered 2020-04-21 – 2020-04-24 (×9): 15 mg via INTRAVENOUS
  Filled 2020-04-21 (×9): qty 1

## 2020-04-21 MED ORDER — METHOCARBAMOL 1000 MG/10ML IJ SOLN
500.0000 mg | Freq: Four times a day (QID) | INTRAVENOUS | Status: DC
  Administered 2020-04-21 – 2020-04-22 (×4): 500 mg via INTRAVENOUS
  Filled 2020-04-21 (×3): qty 5

## 2020-04-21 MED ORDER — CLONAZEPAM 0.5 MG PO TABS
0.5000 mg | ORAL_TABLET | Freq: Two times a day (BID) | ORAL | Status: DC
Start: 2020-04-21 — End: 2020-04-21

## 2020-04-21 MED ORDER — DEXMEDETOMIDINE HCL IN NACL 400 MCG/100ML IV SOLN
0.4000 ug/kg/h | INTRAVENOUS | Status: DC
  Administered 2020-04-21: 0.4 ug/kg/h via INTRAVENOUS
  Administered 2020-04-21: 0.9 ug/kg/h via INTRAVENOUS
  Administered 2020-04-21: 0.7 ug/kg/h via INTRAVENOUS
  Filled 2020-04-21 (×3): qty 100

## 2020-04-21 MED ORDER — DOCUSATE SODIUM 50 MG/5ML PO LIQD: 100.0000 mg | Freq: Two times a day (BID) | ORAL | Status: DC

## 2020-04-21 MED ORDER — HYDROMORPHONE HCL 1 MG/ML IJ SOLN
0.5000 mg | INTRAMUSCULAR | Status: DC | PRN
  Administered 2020-04-22 – 2020-04-23 (×9): 1 mg via INTRAVENOUS
  Filled 2020-04-21 (×9): qty 1

## 2020-04-21 MED ORDER — POTASSIUM CHLORIDE 10 MEQ/100ML IV SOLN
10.0000 meq | INTRAVENOUS | Status: AC
  Administered 2020-04-21 (×4): 10 meq via INTRAVENOUS
  Filled 2020-04-21 (×5): qty 100

## 2020-04-21 MED ORDER — QUETIAPINE FUMARATE 100 MG PO TABS: 100.0000 mg | ORAL_TABLET | Freq: Two times a day (BID) | ORAL | Status: DC

## 2020-04-21 MED ORDER — OXYCODONE HCL 5 MG/5ML PO SOLN
5.0000 mg | ORAL | Status: DC | PRN
  Administered 2020-04-21: 5 mg
  Filled 2020-04-21: qty 5

## 2020-04-21 MED ORDER — QUETIAPINE FUMARATE 100 MG PO TABS
100.0000 mg | ORAL_TABLET | Freq: Two times a day (BID) | ORAL | Status: DC
  Administered 2020-04-21: 100 mg
  Filled 2020-04-21: qty 1

## 2020-04-21 MED ORDER — OXYCODONE HCL 5 MG/5ML PO SOLN: 5.0000 mg | ORAL | Status: DC | PRN

## 2020-04-21 MED ORDER — ACETAMINOPHEN 500 MG PO TABS: 1000.0000 mg | ORAL_TABLET | Freq: Four times a day (QID) | ORAL | Status: DC

## 2020-04-21 MED ORDER — POTASSIUM CHLORIDE 20 MEQ PO PACK
40.0000 meq | PACK | Freq: Every day | ORAL | Status: DC
  Administered 2020-04-22: 40 meq via ORAL
  Filled 2020-04-21: qty 2

## 2020-04-21 MED ORDER — POLYETHYLENE GLYCOL 3350 17 G PO PACK: 17.0000 g | PACK | Freq: Every day | ORAL | Status: DC

## 2020-04-21 MED ORDER — GABAPENTIN 250 MG/5ML PO SOLN
300.0000 mg | Freq: Three times a day (TID) | ORAL | Status: DC
  Filled 2020-04-21 (×3): qty 6

## 2020-04-21 MED ORDER — VITAMIN D 25 MCG (1000 UNIT) PO TABS
2000.0000 [IU] | ORAL_TABLET | Freq: Two times a day (BID) | ORAL | Status: DC
  Administered 2020-04-22 – 2020-04-24 (×5): 2000 [IU] via ORAL
  Filled 2020-04-21 (×5): qty 2

## 2020-04-21 MED ORDER — MAGNESIUM SULFATE IN D5W 1-5 GM/100ML-% IV SOLN
1.0000 g | Freq: Once | INTRAVENOUS | Status: AC
  Administered 2020-04-21: 1 g via INTRAVENOUS
  Filled 2020-04-21: qty 100
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
## 2020-04-21 NOTE — Procedures (Signed)
Extubation Procedure Note  Patient Details:   Name: Nicholas Lopez DOB: 07/23/1990 MRN: [id number]   Airway Documentation:    Vent end date: 04/18/20 Vent end time: 1130   Evaluation  O2 sats: stable throughout Complications: No apparent complications Patient did tolerate procedure well. Bilateral Breath Sounds: Clear,Diminished   Yes, patient able to speak.  Patient extubated at 0844 per MD. MD at bedside. 4L nasal cannula applied. Patient tolerated well. Vitals stable.    Farris Has 04/21/2020, 8:49 AM

## 2020-04-22 ENCOUNTER — Inpatient Hospital Stay (HOSPITAL_COMMUNITY): Payer: No Typology Code available for payment source

## 2020-04-22 LAB — BASIC METABOLIC PANEL
Anion gap: 10 (ref 5–15)
BUN: 13 mg/dL (ref 6–20)
CO2: 27 mmol/L (ref 22–32)
Calcium: 8.6 mg/dL — ABNORMAL LOW (ref 8.9–10.3)
Chloride: 103 mmol/L (ref 98–111)
Creatinine, Ser: 0.65 mg/dL (ref 0.61–1.24)
GFR, Estimated: >60 mL/min (ref >60)
Glucose, Bld: 104 mg/dL — ABNORMAL HIGH (ref 70–99)
Potassium: 3.4 mmol/L — ABNORMAL LOW (ref 3.5–5.1)
Sodium: 140 mmol/L (ref 135–145)

## 2020-04-22 LAB — GLUCOSE, CAPILLARY
Glucose-Capillary: 100 mg/dL — ABNORMAL HIGH (ref 70–99)
Glucose-Capillary: 97 mg/dL (ref 70–99)
Glucose-Capillary: 101 mg/dL — ABNORMAL HIGH (ref 70–99)
Glucose-Capillary: 138 mg/dL — ABNORMAL HIGH (ref 70–99)

## 2020-04-22 LAB — CBC
HCT: 22.5 % — ABNORMAL LOW (ref 39.0–52.0)
Hemoglobin: 7.6 g/dL — ABNORMAL LOW (ref 13.0–17.0)
MCH: 31 pg (ref 26.0–34.0)
MCHC: 33.8 g/dL (ref 30.0–36.0)
MCV: 91.8 fL (ref 80.0–100.0)
Platelets: 219 10*3/uL (ref 150–400)
RBC: 2.45 MIL/uL — ABNORMAL LOW (ref 4.22–5.81)
RDW: 13 % (ref 11.5–15.5)
WBC: 12.8 10*3/uL — ABNORMAL HIGH (ref 4.0–10.5)
nRBC: 0 % (ref 0.0–0.2)

## 2020-04-22 MED ORDER — POTASSIUM CHLORIDE 20 MEQ PO PACK
40.0000 meq | PACK | ORAL | Status: AC
  Administered 2020-04-22: 40 meq via ORAL
  Filled 2020-04-22: qty 2

## 2020-04-22 MED ORDER — GABAPENTIN 300 MG PO CAPS
300.0000 mg | ORAL_CAPSULE | Freq: Three times a day (TID) | ORAL | Status: DC
  Administered 2020-04-22 – 2020-04-24 (×7): 300 mg via ORAL
  Filled 2020-04-22 (×7): qty 1

## 2020-04-22 MED ORDER — ENSURE ENLIVE PO LIQD
237.0000 mL | Freq: Two times a day (BID) | ORAL | Status: DC
  Administered 2020-04-23 – 2020-04-24 (×4): 237 mL via ORAL

## 2020-04-22 MED ORDER — CHLORHEXIDINE GLUCONATE 0.12 % MT SOLN
15.0000 mL | Freq: Two times a day (BID) | OROMUCOSAL | Status: DC
  Administered 2020-04-22: 15 mL via OROMUCOSAL
  Filled 2020-04-22: qty 15

## 2020-04-22 MED ORDER — ORAL CARE MOUTH RINSE: 15.0000 mL | Freq: Two times a day (BID) | OROMUCOSAL | Status: DC

## 2020-04-22 MED ORDER — METHOCARBAMOL 500 MG PO TABS
500.0000 mg | ORAL_TABLET | Freq: Four times a day (QID) | ORAL | Status: DC
  Administered 2020-04-22 – 2020-04-24 (×10): 500 mg via ORAL
  Filled 2020-04-22 (×10): qty 1

## 2020-04-22 MED ORDER — VANCOMYCIN HCL 1250 MG/250ML IV SOLN
1250.0000 mg | Freq: Two times a day (BID) | INTRAVENOUS | Status: DC
  Administered 2020-04-22 – 2020-04-24 (×4): 1250 mg via INTRAVENOUS
  Filled 2020-04-22 (×5): qty 250

## 2020-04-22 MED ORDER — HALOPERIDOL LACTATE 5 MG/ML IJ SOLN: 5.0000 mg | Freq: Four times a day (QID) | INTRAMUSCULAR | Status: DC | PRN

## 2020-04-22 MED ORDER — VANCOMYCIN HCL 1500 MG/300ML IV SOLN
1500.0000 mg | Freq: Once | INTRAVENOUS | Status: AC
  Administered 2020-04-22: 1500 mg via INTRAVENOUS
  Filled 2020-04-22: qty 300
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
## 2020-04-23 ENCOUNTER — Encounter (HOSPITAL_COMMUNITY): Payer: Self-pay

## 2020-04-23 LAB — CBC
HCT: 24.4 % — ABNORMAL LOW (ref 39.0–52.0)
Hemoglobin: 8 g/dL — ABNORMAL LOW (ref 13.0–17.0)
MCH: 30.8 pg (ref 26.0–34.0)
MCHC: 32.8 g/dL (ref 30.0–36.0)
MCV: 93.8 fL (ref 80.0–100.0)
Platelets: 340 10*3/uL (ref 150–400)
RBC: 2.6 MIL/uL — ABNORMAL LOW (ref 4.22–5.81)
RDW: 13.4 % (ref 11.5–15.5)
WBC: 9.6 10*3/uL (ref 4.0–10.5)
nRBC: 0.3 % — ABNORMAL HIGH (ref 0.0–0.2)

## 2020-04-23 LAB — GLUCOSE, CAPILLARY
Glucose-Capillary: 118 mg/dL — ABNORMAL HIGH (ref 70–99)
Glucose-Capillary: 97 mg/dL (ref 70–99)
Glucose-Capillary: 170 mg/dL — ABNORMAL HIGH (ref 70–99)
Glucose-Capillary: 94 mg/dL (ref 70–99)

## 2020-04-23 LAB — BASIC METABOLIC PANEL
Anion gap: 9 (ref 5–15)
BUN: 14 mg/dL (ref 6–20)
CO2: 27 mmol/L (ref 22–32)
Calcium: 8.7 mg/dL — ABNORMAL LOW (ref 8.9–10.3)
Chloride: 104 mmol/L (ref 98–111)
Creatinine, Ser: 0.63 mg/dL (ref 0.61–1.24)
GFR, Estimated: >60 mL/min (ref >60)
Glucose, Bld: 93 mg/dL (ref 70–99)
Potassium: 3.4 mmol/L — ABNORMAL LOW (ref 3.5–5.1)
Sodium: 140 mmol/L (ref 135–145)

## 2020-04-23 MED ORDER — POTASSIUM CHLORIDE 20 MEQ PO PACK
20.0000 meq | PACK | Freq: Two times a day (BID) | ORAL | Status: DC
  Administered 2020-04-23 – 2020-04-24 (×3): 20 meq via ORAL
  Filled 2020-04-23 (×3): qty 1

## 2020-04-23 MED ORDER — ZOLPIDEM TARTRATE 5 MG PO TABS
10.0000 mg | ORAL_TABLET | Freq: Every evening | ORAL | Status: DC | PRN
  Administered 2020-04-23: 10 mg via ORAL
  Filled 2020-04-23: qty 2

## 2020-04-23 MED ORDER — HYDROMORPHONE HCL 1 MG/ML IJ SOLN
0.5000 mg | INTRAMUSCULAR | Status: DC | PRN
Start: 2020-04-23 — End: 2020-04-25
  Administered 2020-04-23 – 2020-04-24 (×10): 1 mg via INTRAVENOUS
  Filled 2020-04-23 (×10): qty 1

## 2020-04-23 MED ORDER — SODIUM CHLORIDE 0.9% FLUSH: 10.0000 mL | INTRAVENOUS | Status: DC | PRN
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
## 2020-04-23 NOTE — TOC Progression Note (Signed)
Transition of Care [facility]) - Progression Note    Patient Details  Name: Nicholas Lopez MRN: [id number] Date of Birth: Jul 27, 1990  Transition of Care [facility]) CM/SW Contact  Ella Bodo, RN Phone Number: 04/23/2020, 4:19 PM  Clinical Narrative:  Met with pt and brother to discuss dc plans.  Pt states he plans to dc home with his sister in [place], who can assist him with care.  He is concerned about getting assistance at discharge, as he is uninsured.  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  He has not signed up for VA benefits, or gone to a [place] PCP, so I am doubtful that he can access services.  Pt may be eligible for charity services through [place] and DME provider.  Notified [facility] financial counseling of need for consultation; pt is interested in applying for Medicaid if possible.  Finacial Counselor Elie Confer states she will visit patient and bring Medicaid application. Would be eligible for [facility] medication assistance; recommend sending dc Rx to [facility] if pt discharged M-F.   Will follow for discharge needs; await return call from the [place].       Expected Discharge Plan: [place] Barriers to Discharge: Continued Medical Work up  Expected Discharge Plan and Services Expected Discharge Plan: [place]   Discharge Planning Services: CM Consult   Living arrangements for the past 2 months: Single Family Home                                       Social Determinants of Health (SDOH) Interventions    Readmission Risk Interventions No flowsheet data found.  Reinaldo Raddle, RN, BSN  Trauma/Neuro ICU Case Manager [phone]
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
## 2020-04-24 ENCOUNTER — Inpatient Hospital Stay (HOSPITAL_COMMUNITY): Payer: No Typology Code available for payment source

## 2020-04-24 ENCOUNTER — Other Ambulatory Visit (HOSPITAL_COMMUNITY): Payer: Self-pay | Admitting: General Surgery

## 2020-04-24 LAB — BASIC METABOLIC PANEL
Anion gap: 11 (ref 5–15)
BUN: 16 mg/dL (ref 6–20)
CO2: 26 mmol/L (ref 22–32)
Calcium: 9.2 mg/dL (ref 8.9–10.3)
Chloride: 100 mmol/L (ref 98–111)
Creatinine, Ser: 0.65 mg/dL (ref 0.61–1.24)
GFR, Estimated: >60 mL/min (ref >60)
Glucose, Bld: 127 mg/dL — ABNORMAL HIGH (ref 70–99)
Potassium: 4.1 mmol/L (ref 3.5–5.1)
Sodium: 137 mmol/L (ref 135–145)

## 2020-04-24 LAB — CBC
HCT: 24.9 % — ABNORMAL LOW (ref 39.0–52.0)
Hemoglobin: 8.7 g/dL — ABNORMAL LOW (ref 13.0–17.0)
MCH: 32.3 pg (ref 26.0–34.0)
MCHC: 34.9 g/dL (ref 30.0–36.0)
MCV: 92.6 fL (ref 80.0–100.0)
Platelets: 407 10*3/uL — ABNORMAL HIGH (ref 150–400)
RBC: 2.69 MIL/uL — ABNORMAL LOW (ref 4.22–5.81)
RDW: 13.9 % (ref 11.5–15.5)
WBC: 10.9 10*3/uL — ABNORMAL HIGH (ref 4.0–10.5)
nRBC: 0.2 % (ref 0.0–0.2)

## 2020-04-24 MED ORDER — VITAMIN D3 25 MCG PO TABS: 2000.0000 [IU] | ORAL_TABLET | Freq: Two times a day (BID) | ORAL | Status: DC

## 2020-04-24 MED ORDER — SULFAMETHOXAZOLE-TRIMETHOPRIM 800-160 MG PO TABS: 1.0000 | ORAL_TABLET | Freq: Two times a day (BID) | ORAL | 0 refills | Status: DC

## 2020-04-24 MED ORDER — OXYCODONE HCL 5 MG PO TABS
5.0000 mg | ORAL_TABLET | ORAL | Status: DC | PRN
  Administered 2020-04-24 (×2): 10 mg via ORAL
  Filled 2020-04-24 (×2): qty 2

## 2020-04-24 MED ORDER — SULFAMETHOXAZOLE-TRIMETHOPRIM 800-160 MG PO TABS
1.0000 | ORAL_TABLET | Freq: Two times a day (BID) | ORAL | Status: DC
  Administered 2020-04-24: 1 via ORAL
  Filled 2020-04-24: qty 1

## 2020-04-24 MED ORDER — IBUPROFEN 200 MG PO TABS: 600.0000 mg | ORAL_TABLET | Freq: Three times a day (TID) | ORAL | 2 refills | Status: AC | PRN

## 2020-04-24 MED ORDER — METHOCARBAMOL 500 MG PO TABS: 500.0000 mg | ORAL_TABLET | Freq: Four times a day (QID) | ORAL | 1 refills | Status: DC | PRN

## 2020-04-24 MED ORDER — OXYCODONE HCL 5 MG PO TABS: 5.0000 mg | ORAL_TABLET | ORAL | 0 refills | Status: DC | PRN

## 2020-04-24 MED ORDER — GABAPENTIN 300 MG PO CAPS: 300.0000 mg | ORAL_CAPSULE | Freq: Three times a day (TID) | ORAL | 0 refills | Status: DC | PRN

## 2020-04-24 MED FILL — GABAPENTIN 300 MG CAPSULE: 300 | 10 days supply | Qty: 30 | Fill #0

## 2020-04-24 MED FILL — SULFAMETHOXAZOLE-TMP DS TAB: 800-160 | 4 days supply | Qty: 8 | Fill #0

## 2020-04-24 MED FILL — oxyCODONE HCL 5 MG TABS: 5 | 6 days supply | Qty: 40 | Fill #0

## 2020-04-24 MED FILL — METHOCARBAMOL 500 MG TABS: 500 | 15 days supply | Qty: 60 | Fill #0
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
## 2020-04-24 NOTE — Discharge Instructions (Signed)
Liver/spleen Laceration  A liver laceration is a tear or a cut in the liver, an organ with many important functions in the body. Sometimes, a liver laceration can be a very serious injury as it can cause a lot of bleeding, and surgery may be needed. Other times, a liver laceration may be minor, and bed rest may be all that is needed. Either way, evaluation and treatment in a hospital is almost always required. Liver lacerations are categorized as Grades 1 to 5, with Grade 1 being the least severe and Grade 5 being the most serious. What are the causes? This condition may be caused by:  A forceful hit to the area around the liver (blunt trauma), such as from a car crash. Blunt trauma can tear the liver even though it does not break the skin.  An injury in which an object goes through the skin and into the liver (penetrating injury), such as a stab or gunshot wound. What are the signs or symptoms? Common symptoms of this condition include:  A swollen and firm abdomen.  Pain in the abdomen.  Tenderness when pressing on the abdomen, especially on the right side of abdomen where the liver is located. Other symptoms include:  Bleeding from a penetrating wound.  Bruises on the abdomen.  A fast heartbeat.  Taking quick breaths.  Feeling weak and dizzy.  Looking pale due to blood loss. How is this diagnosed? This condition is diagnosed with a physical exam and medical history. Your health care provider may ask about any injuries to your chest or abdomen. You may also have tests, such as:  Blood tests. Your blood may be tested every few hours to see if you are losing blood.  Ultrasound. This may be done to look for blood in the abdomen.  CT scan. This may be done to look for blood in the abdomen.  Laparoscopy. This involves placing a small camera into the abdomen and looking directly at the surface of the liver. How is this treated? Treatment for this condition depends on the depth of  the laceration, how much bleeding you have, and your overall condition. Treatment may include:  Monitoring and bed rest at a hospital.  Receiving donated blood through an IV (transfusion) to replace the blood that you have lost. You may need several transfusions.  Surgery to: ? Pack surgical gauze pads around the laceration to control the bleeding. ? Repair any injured blood vessels in your liver. ? Stop the bleeding from the arteries in the liver. Follow these instructions at home: Wound care  If you have a wound from a penetrating injury, follow instructions from your health care provider about how to take care of your wound. Make sure you: ? Wash your hands with soap and water for at least 20 seconds before and after you change your bandage (dressing). If soap and water are not available, use hand sanitizer. ? Change your dressing as told by your health care provider. ? Leave stitches (sutures), skin glue, or adhesive strips in place. These skin closures may need to stay in place for 2 weeks or longer. If adhesive strip edges start to loosen and curl up, you may trim the loose edges. Do not remove adhesive strips completely unless your health care provider tells you to do that. ? If you have tubes placed in the wound to drain out fluids (drains), make sure to take care of them as told by your health care provider.  Check your wound every  day for signs of infection. Check for: ? More redness, warmth, swelling, tenderness, or pain. ? More fluid or blood that drains from the wound. ? Pus or a bad smell. ? Stitches or wounds that seem to be opening up (dehiscence).  Do not take baths, swim, or use a hot tub until your health care provider approves. Ask your health care provider if you may take showers. You may only be allowed to take sponge baths. Activity  Rest as told by your health care provider.  Ask your health care provider what activities are safe for you.  Do not lift anything  that is heavier than 10 lb (4.5 kg), or the limit that you are told, until your health care provider says that it is safe.  Do not do activities that involve physical contact or require extra energy until your health care provider says that it is safe.   General instructions  Take over-the-counter and prescription medicines only as told by your health care provider. Do not take any other medicines unless you ask your health care provider about them first.  Ask your health care provider if the medicine prescribed to you requires you to avoid driving or using machinery.  Do not use any products that contain nicotine or tobacco, such as cigarettes, e-cigarettes, and chewing tobacco. These can delay healing. If you need help quitting, ask your health care provider.  Keep all follow-up visits as told by your health care provider. This is important.   Contact a health care provider if:  Your abdominal pain does not go away.  You feel more weak and tired than usual.  You have a fever. Get help right away if:  Your abdominal pain gets worse.  You have trouble breathing.  You feel dizzy or very weak.  You feel confused or disoriented.  You have a wound on your skin that: ? Has more redness, swelling, or pain around it. ? Has more fluid or blood coming from it. ? Feels warm to the touch. ? Has pus or a bad smell coming from it. ? Has stitches or areas that seem to be opening up (dehiscence). Summary  A liver laceration is a tear or a cut in the liver.  A liver laceration can be a very serious injury that requires surgery to control bleeding.  Tests such as ultrasound or CT scans may be done to look at blood in the abdomen.  Treatment for this condition depends on how deep the laceration is, how much bleeding you have, and your overall condition. This information is not intended to replace advice given to you by your health care provider. Make sure you discuss any questions you have  with your health care provider. Document Revised: 03/01/2019 Document Reviewed: 03/01/2019 Elsevier Patient Education  2021 Elsevier Inc.  Weightbearing: NonWeightBaring Left Upper Extremity and Left Lower Extremity Incisional and dressing care:  LUE: maintain splints LLE: Ok to leave incisions open to air

## 2020-04-24 NOTE — TOC Transition Note (Addendum)
Transition of Care [facility]) - CM/SW Discharge Note   Patient Details  Name: Nicholas Lopez MRN: [id number] Date of Birth: [birth year]  [facility] CM/SW Contact:  Glennon Mac, RN Phone Number: 04/24/2020, 1100  Clinical Narrative:   Received notification from [place] that pt is not [facility] with the [facility], and will not be able to access any resources through them. Pt will be evaluated for charity services for [facility] and DME.  Requested DME from [facility]; they are able to provide RW and 3 in 1, but currently have no wheelchairs.  Will reach out to other DME agencies for assistance with WC.    1535 Addendum: Notified by charity [facility] agency, that pt not approved for [facility] services as they are having staffing issues.  Pt agreeable to follow up at [facility] rehab for OP services., as it is near his sister's home. Will make referrals accordingly.  Able to obtain [facility] from [place] [phone], that will be delivered between now and 7pm.  Pt made aware, and is appreciative of all assistance.    Final next level of care: OP Rehab Barriers to Discharge: Barriers Resolved   Patient Goals and CMS Choice Patient states their goals for this hospitalization and ongoing recovery are:: to go home CMS Medicare.gov Compare Post Acute Care list provided to:: Patient Choice offered to / list presented to : Patient                        Discharge Plan and Services   Discharge Planning Services: CM Consult Post Acute Care Choice: Home Health          DME Arranged: 3-N-1,Wheelchair manual,Walker rolling DME Agency: [facility] Date DME Agency Contacted: 04/24/20 Time DME Agency Contacted: 1020 Representative spoke with at DME Agency: Beckey Rutter at [facility]            Social Determinants of Health (SDOH) Interventions     Readmission Risk Interventions Readmission Risk Prevention Plan 04/24/2020  Post Dischage Appt Complete  Medication Screening  Complete  Transportation Screening Complete   Quintella Baton, RN, BSN  Trauma/Neuro ICU Case Manager [phone]
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
## 2020-04-24 NOTE — Care Management (Addendum)
    Durable Medical Equipment  (From admission, onward)         Start     Ordered   04/24/20 1059  For home use only DME 3 n 1  Once        04/24/20 1058   04/24/20 1058  For home use only DME Walker rolling  Once       Question Answer Comment  Walker: With 5 Inch Wheels   Patient needs a walker to treat with the following condition Femur fracture, left (HCC)      04/24/20 1058   04/24/20 1056  For home use only DME lightweight manual wheelchair with seat cushion  Once       Comments: Patient suffers from femur fracture, patella fx which impairs their ability to perform daily activities like ambulating in the home.  A RW will not resolve  issue with performing activities of daily living. A wheelchair will allow patient to safely perform daily activities. Patient is not able to propel themselves in the home using a standard weight wheelchair due multiple rib fractures, pain. Patient can self propel in the lightweight wheelchair. Length of need: 6 months Accessories: elevating leg rests (ELRs), wheel locks, extensions and anti-tippers.   04/24/20 1058         Quintella Baton, RN, BSN  Trauma/Neuro ICU Case Manager [phone]

## 2020-04-24 NOTE — Progress Notes (Signed)
Called CM regarding home equipment (wheelchair) delivery and per CM , equipment was ordered and will be delivered by 7PM. Patient will have to have the equipment prior to discharge.

## 2020-04-24 NOTE — Progress Notes (Signed)
Patient discharged to home via wheelchair accompanied by his wife. Discharged instructions and packet was given earlier by day shift RN, Christel Mormon to patient and patient verbalized understanding. Patient's front wheel walker and wheelchair were also brought by the patient on discharged.
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
## 2020-04-25 ENCOUNTER — Other Ambulatory Visit: Payer: Self-pay

## 2020-04-25 ENCOUNTER — Emergency Department: Payer: Self-pay

## 2020-04-25 ENCOUNTER — Emergency Department
Admission: EM | Admit: 2020-04-25 | Discharge: 2020-04-25 | Discharge status: Against Medical Advice | Payer: Self-pay | Attending: Emergency Medicine | Admitting: Emergency Medicine

## 2020-04-25 ENCOUNTER — Encounter: Payer: Self-pay | Admitting: Emergency Medicine

## 2020-04-25 ENCOUNTER — Inpatient Hospital Stay
Admission: EM | Admit: 2020-04-25 | Payer: No Typology Code available for payment source | Source: Other Acute Inpatient Hospital

## 2020-04-25 DIAGNOSIS — X58XXXA Exposure to other specified factors, initial encounter: Secondary | ICD-10-CM | POA: Insufficient documentation

## 2020-04-25 DIAGNOSIS — Z20822 Contact with and (suspected) exposure to covid-19: Secondary | ICD-10-CM | POA: Insufficient documentation

## 2020-04-25 DIAGNOSIS — R079 Chest pain, unspecified: Secondary | ICD-10-CM | POA: Insufficient documentation

## 2020-04-25 DIAGNOSIS — F17210 Nicotine dependence, cigarettes, uncomplicated: Secondary | ICD-10-CM | POA: Insufficient documentation

## 2020-04-25 DIAGNOSIS — S0511XA Contusion of eyeball and orbital tissues, right eye, initial encounter: Secondary | ICD-10-CM | POA: Insufficient documentation

## 2020-04-25 DIAGNOSIS — J939 Pneumothorax, unspecified: Secondary | ICD-10-CM | POA: Insufficient documentation

## 2020-04-25 LAB — SARS CORONAVIRUS 2 BY RT PCR (HOSPITAL ORDER, PERFORMED IN ~~LOC~~ HOSPITAL LAB): SARS Coronavirus 2: NEGATIVE

## 2020-04-25 LAB — CBC
HCT: 29.5 % — ABNORMAL LOW (ref 39.0–52.0)
Hemoglobin: 9.8 g/dL — ABNORMAL LOW (ref 13.0–17.0)
MCH: 31.2 pg (ref 26.0–34.0)
MCHC: 33.2 g/dL (ref 30.0–36.0)
MCV: 93.9 fL (ref 80.0–100.0)
Platelets: 561 10*3/uL — ABNORMAL HIGH (ref 150–400)
RBC: 3.14 MIL/uL — ABNORMAL LOW (ref 4.22–5.81)
RDW: 14.5 % (ref 11.5–15.5)
WBC: 17.2 10*3/uL — ABNORMAL HIGH (ref 4.0–10.5)
nRBC: 0.2 % (ref 0.0–0.2)

## 2020-04-25 LAB — BASIC METABOLIC PANEL
Anion gap: 14 (ref 5–15)
BUN: 19 mg/dL (ref 6–20)
CO2: 25 mmol/L (ref 22–32)
Calcium: 9.6 mg/dL (ref 8.9–10.3)
Chloride: 100 mmol/L (ref 98–111)
Creatinine, Ser: 0.76 mg/dL (ref 0.61–1.24)
GFR, Estimated: >60 mL/min (ref >60)
Glucose, Bld: 112 mg/dL — ABNORMAL HIGH (ref 70–99)
Potassium: 4.3 mmol/L (ref 3.5–5.1)
Sodium: 139 mmol/L (ref 135–145)

## 2020-04-25 LAB — TROPONIN I (HIGH SENSITIVITY): Troponin I (High Sensitivity): 4 ng/L (ref <18)

## 2020-04-25 LAB — PROTIME-INR
INR: 1.1 (ref 0.8–1.2)
Prothrombin Time: 13.4 seconds (ref 11.4–15.2)

## 2020-04-25 MED ORDER — HYDROMORPHONE HCL 1 MG/ML IJ SOLN
INTRAMUSCULAR | Status: AC
  Administered 2020-04-25: 1 mg via INTRAVENOUS
  Filled 2020-04-25: qty 1

## 2020-04-25 MED ORDER — FENTANYL CITRATE (PF) 100 MCG/2ML IJ SOLN
INTRAMUSCULAR | Status: AC
  Filled 2020-04-25: qty 2

## 2020-04-25 MED ORDER — FENTANYL CITRATE (PF) 100 MCG/2ML IJ SOLN
100.0000 ug | Freq: Once | INTRAMUSCULAR | Status: AC
Start: 2020-04-25 — End: 2020-04-25
  Administered 2020-04-25: 100 ug via INTRAVENOUS

## 2020-04-25 MED ORDER — MORPHINE SULFATE (PF) 4 MG/ML IV SOLN
4.0000 mg | Freq: Once | INTRAVENOUS | Status: AC
  Administered 2020-04-25: 4 mg via INTRAVENOUS
  Filled 2020-04-25: qty 1

## 2020-04-25 MED ORDER — LIDOCAINE HCL (PF) 1 % IJ SOLN
10.0000 mL | Freq: Once | INTRAMUSCULAR | Status: AC
  Administered 2020-04-25: 10 mL via INTRADERMAL
  Filled 2020-04-25: qty 10

## 2020-04-25 MED ORDER — FENTANYL CITRATE (PF) 100 MCG/2ML IJ SOLN
50.0000 ug | Freq: Once | INTRAMUSCULAR | Status: AC
  Administered 2020-04-25: 50 ug via INTRAVENOUS
  Filled 2020-04-25: qty 2

## 2020-04-25 MED ORDER — HYDROMORPHONE HCL 1 MG/ML IJ SOLN: 1.0000 mg | INTRAMUSCULAR | Status: AC

## 2020-04-25 MED ORDER — SODIUM CHLORIDE 0.9 % IV SOLN
3.0000 g | INTRAVENOUS | Status: AC
  Administered 2020-04-25: 3 g via INTRAVENOUS
  Filled 2020-04-25: qty 8

## 2020-04-25 NOTE — ED Notes (Signed)
Paper copy of AMA form signed and placed in forms area.

## 2020-04-25 NOTE — ED Notes (Signed)
Pt called out stating that he is having sharp pain in upper chest that just started. EPD informed and will place orders for pain medication

## 2020-04-25 NOTE — ED Notes (Signed)
Pt given warm blanket.

## 2020-04-25 NOTE — ED Notes (Signed)
This RN responded to pt call light where pt states that he wants to leave and he has called a ride. Explained that pt has a chest tube and without it, he could die. Pt verbalized understanding. Pt states "this is the worst hospital care I have received here" and c/o "not getting a meal tray until noon" and staff "not asking if pt had any allergies". Dr. Lenard Lance notified and goes to bedside.

## 2020-04-25 NOTE — ED Notes (Signed)
Pt took his Oxycodone from home. Ok per Dr. Lenard Lance. Pt took 2 tablets.
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
## 2020-04-25 NOTE — ED Notes (Signed)
This RN spoke with EDP and given verbal OK to put diet order in for patient. This RN called and spoke with dinning services and requested a tray be sent up for pt. Informed that order was placed and that RN had called for a tray

## 2020-04-25 NOTE — ED Notes (Signed)
This RN spoke with EDP and requested dose of pain medication for pt as he was having pain from being moved while RN redressed chest tube site.

## 2020-04-25 NOTE — ED Notes (Signed)
Pt given lunch tray.

## 2020-04-25 NOTE — ED Provider Notes (Addendum)
-----------------------------------------   10:04 AM on 04/25/2020 -----------------------------------------  Patient continues to complain of significant left chest pain after chest tube insertion.  I have repeated a chest x-ray.  On repeat a chest x-ray appears to have a kink in the chest tube.  Patient has had serous output from his chest tube.  Spoke to surgery, will attempt retracting the chest tube approximately 6 cm.  After chest tube retraction patient states he is feeling better but continues have some pain but not to the same degree as before.  We will retake an x-ray to evaluate.  Repeat x-ray shows improvement in the kink of the chest tube.  Patient states significant pain relief.  We will keep the chest tube in place.  Continue to await transfer.   Minna Antis, MD 04/25/20 1030  ----------------------------------------- 11:53 AM on 04/25/2020 -----------------------------------------  Patient is quite upset currently.  States he is not being properly cared for at this hospital.  States we gave him a food tray (as he requested) but did not confirm if he had any food allergies prior to giving him the food tray (no known allergies listed in his chart).  Patient states he was not happy with his care at [facility], he is not happy with his care here and he wants to go to [facility].  States he has been in pain ever since the initial chest tube was placed.  Patient has received multiple rounds of pain medication.  I have readjusted the chest tube, which he states gave him pain relief.  He states he is going to go directly to [facility] I spoke to the patient about transferring him from our emergency department to [facility] as requested given his significant medical findings of a pneumothorax, however patient states he already called his ride his ride is here to pick him up and he wants to be discharged now.  Patient has a small bore chest tube in the left chest.  I spoke to the patient that we would need to  pull the chest tube prior to his discharge and that the air could and likely will accumulate in the left chest which could lead to trouble breathing and death.  Patient states he understands but still wants to leave.  Patient appears to have capacity to make his own medical decisions.  I have attempted to talk the patient out of this decision multiple times without success.  Patient is now sitting on the end of the bed with his feet off the bed demanding to leave.  I will pull the chest tube cover in Vaseline gauze and Tegaderm.  Patient leaving AGAINST MEDICAL ADVICE.   Minna Antis, MD 04/25/20 1200

## 2020-04-25 NOTE — ED Notes (Signed)
Pt's chest left side has a saturated dressing from where chest tube was removed 04/24/2020 prior to discharge from hospital

## 2020-04-25 NOTE — ED Notes (Signed)
RN in room to check on pt. Pt sitting up towards end of bed. Pt states that his O2 came off of the wall. O2 hooked back up. Pt states that he is starting to have pain again. Pt asked about taking his Oxycodone that he has with him. Pt states that he was told that he could continue to take it every 4 hours. Pt informed that I would check with EDP as we have just recently given him pain medication.

## 2020-04-25 NOTE — ED Notes (Signed)
Pt's sister out front to take pt home/to another hospital. Pt refusing to let staff help him get dressed or gather his belongings. Pt urged to sit in wheelchair when finished to be wheeled out.

## 2020-04-25 NOTE — ED Notes (Signed)
Dr. York Cerise and Dr. Don Perking at [facility] w/pt to perform bedside thoracostomy.  Consent form signed.  Pt with NRB on at 15L/min and in NAD.

## 2020-04-25 NOTE — ED Notes (Signed)
This RN, Vernona Rieger, EDT, and Shawna Orleans, EDT in room due to monitoring alarming for low SpO2 level, pt found to be sitting up near edge of bed. Pt reports that he is still have severe pain in his left upper chest. Pt was repositioned in bed and EDP called to bedside to reassess pt due to continued pain.

## 2020-04-27 ENCOUNTER — Other Ambulatory Visit (HOSPITAL_COMMUNITY): Payer: Self-pay | Admitting: Emergency Medicine

## 2020-04-27 ENCOUNTER — Emergency Department (HOSPITAL_COMMUNITY): Payer: Self-pay

## 2020-04-27 ENCOUNTER — Emergency Department (HOSPITAL_COMMUNITY)
Admission: EM | Admit: 2020-04-27 | Discharge: 2020-04-27 | Disposition: A | Discharge status: Home / Self Care | Payer: Self-pay | Attending: Emergency Medicine | Admitting: Emergency Medicine

## 2020-04-27 DIAGNOSIS — Y92410 Unspecified street and highway as the place of occurrence of the external cause: Secondary | ICD-10-CM | POA: Diagnosis not present

## 2020-04-27 DIAGNOSIS — F17210 Nicotine dependence, cigarettes, uncomplicated: Secondary | ICD-10-CM | POA: Diagnosis not present

## 2020-04-27 DIAGNOSIS — S299XXA Unspecified injury of thorax, initial encounter: Secondary | ICD-10-CM | POA: Diagnosis present

## 2020-04-27 DIAGNOSIS — S2242XA Multiple fractures of ribs, left side, initial encounter for closed fracture: Secondary | ICD-10-CM | POA: Insufficient documentation

## 2020-04-27 DIAGNOSIS — R0602 Shortness of breath: Secondary | ICD-10-CM | POA: Insufficient documentation

## 2020-04-27 LAB — COMPREHENSIVE METABOLIC PANEL
ALT: 53 U/L — ABNORMAL HIGH (ref 0–44)
AST: 61 U/L — ABNORMAL HIGH (ref 15–41)
Albumin: 3.4 g/dL — ABNORMAL LOW (ref 3.5–5.0)
Alkaline Phosphatase: 129 U/L — ABNORMAL HIGH (ref 38–126)
Anion gap: 12 (ref 5–15)
BUN: 19 mg/dL (ref 6–20)
CO2: 21 mmol/L — ABNORMAL LOW (ref 22–32)
Calcium: 8.7 mg/dL — ABNORMAL LOW (ref 8.9–10.3)
Chloride: 102 mmol/L (ref 98–111)
Creatinine, Ser: 0.83 mg/dL (ref 0.61–1.24)
GFR, Estimated: >60 mL/min (ref >60)
Glucose, Bld: 102 mg/dL — ABNORMAL HIGH (ref 70–99)
Potassium: 4.5 mmol/L (ref 3.5–5.1)
Sodium: 135 mmol/L (ref 135–145)
Total Bilirubin: 1.2 mg/dL (ref 0.3–1.2)
Total Protein: 6.9 g/dL (ref 6.5–8.1)

## 2020-04-27 LAB — CBC WITH DIFFERENTIAL/PLATELET
Abs Immature Granulocytes: 0.3 10*3/uL — ABNORMAL HIGH (ref 0.00–0.07)
Basophils Absolute: 0.1 10*3/uL (ref 0.0–0.1)
Basophils Relative: 0 %
Eosinophils Absolute: 0.2 10*3/uL (ref 0.0–0.5)
Eosinophils Relative: 1 %
HCT: 29.8 % — ABNORMAL LOW (ref 39.0–52.0)
Hemoglobin: 9.3 g/dL — ABNORMAL LOW (ref 13.0–17.0)
Immature Granulocytes: 2 %
Lymphocytes Relative: 8 %
Lymphs Abs: 1.5 10*3/uL (ref 0.7–4.0)
MCH: 31.2 pg (ref 26.0–34.0)
MCHC: 31.2 g/dL (ref 30.0–36.0)
MCV: 100 fL (ref 80.0–100.0)
Monocytes Absolute: 1.7 10*3/uL — ABNORMAL HIGH (ref 0.1–1.0)
Monocytes Relative: 9 %
Neutro Abs: 15.4 10*3/uL — ABNORMAL HIGH (ref 1.7–7.7)
Neutrophils Relative %: 80 %
Platelets: 636 10*3/uL — ABNORMAL HIGH (ref 150–400)
RBC: 2.98 MIL/uL — ABNORMAL LOW (ref 4.22–5.81)
RDW: 14.8 % (ref 11.5–15.5)
WBC: 19.2 10*3/uL — ABNORMAL HIGH (ref 4.0–10.5)
nRBC: 0 % (ref 0.0–0.2)

## 2020-04-27 MED ORDER — LIDOCAINE 4 % EX PTCH: 1.0000 | MEDICATED_PATCH | Freq: Every day | CUTANEOUS | 0 refills | Status: DC

## 2020-04-27 MED ORDER — OXYCODONE HCL 5 MG PO TABS: 5.0000 mg | ORAL_TABLET | Freq: Four times a day (QID) | ORAL | 0 refills | Status: DC | PRN

## 2020-04-27 MED ORDER — FENTANYL CITRATE (PF) 100 MCG/2ML IJ SOLN
50.0000 ug | Freq: Once | INTRAMUSCULAR | Status: AC
  Administered 2020-04-27: 50 ug via INTRAVENOUS
  Filled 2020-04-27: qty 2

## 2020-04-27 MED ORDER — GABAPENTIN 300 MG PO CAPS: 300.0000 mg | ORAL_CAPSULE | Freq: Three times a day (TID) | ORAL | 0 refills | Status: DC | PRN

## 2020-04-27 MED ORDER — LIDOCAINE 5 % EX PTCH
1.0000 | MEDICATED_PATCH | CUTANEOUS | Status: DC
  Administered 2020-04-27: 1 via TRANSDERMAL
  Filled 2020-04-27: qty 1

## 2020-04-27 MED FILL — GABAPENTIN 300 MG CAPSULE: 300 | 6 days supply | Qty: 20 | Fill #0

## 2020-04-27 MED FILL — oxyCODONE HCL 5 MG TABS: 5 | 5 days supply | Qty: 20 | Fill #0
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
## 2020-04-27 NOTE — ED Notes (Signed)
Removed old ace wrap from pt left wrist and replaced it with a clean one.

## 2020-04-27 NOTE — Discharge Planning (Signed)
RNCM consulted in regards to medication assistance.  Pt has insurance coverage and is not eligible for Medication Assistance Through [facility]) program as he was enrolled last week.  RNCM spoke with [facility] consulted  Transitions [facility] Care Pharmacy ([facility]) regarding this matter.  [facility] will deliver Rx to patient at bedside prior to discharge from hospital and collect co-pay.
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
## 2020-04-27 NOTE — Discharge Instructions (Addendum)
I have written you for some additional gabapentin and Roxicodone to use after your current prescriptions are right out if you need these meds please pick up the prescriptions.  Continue antibiotics as prescribed.  Continue to be nonweightbearing to your left lower extremity and left upper extremity until follow-up with surgery teams.
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
## 2020-04-27 NOTE — Discharge Planning (Deleted)
Medications sent to alternate pharmacy to be picked up in a few days.  T[facility]P updated of change in plans.

## 2020-05-07 ENCOUNTER — Other Ambulatory Visit: Payer: Self-pay

## 2020-05-07 ENCOUNTER — Ambulatory Visit
Admission: RE | Admit: 2020-05-07 | Discharge: 2020-05-07 | Disposition: A | Discharge status: Home / Self Care | Payer: Self-pay | Source: Ambulatory Visit | Attending: General Surgery | Admitting: General Surgery

## 2020-05-07 ENCOUNTER — Other Ambulatory Visit: Payer: Self-pay | Admitting: General Surgery

## 2020-05-07 DIAGNOSIS — J939 Pneumothorax, unspecified: Secondary | ICD-10-CM

## 2020-05-14 ENCOUNTER — Inpatient Hospital Stay: Payer: Self-pay | Admitting: Internal Medicine

## 2020-05-15 ENCOUNTER — Telehealth: Payer: Self-pay | Admitting: Nurse Practitioner

## 2020-05-15 ENCOUNTER — Other Ambulatory Visit: Payer: Self-pay

## 2020-05-15 ENCOUNTER — Encounter: Payer: Self-pay | Admitting: Nurse Practitioner

## 2020-05-15 ENCOUNTER — Ambulatory Visit: Payer: Self-pay | Attending: Internal Medicine | Admitting: Nurse Practitioner

## 2020-05-15 DIAGNOSIS — D72829 Elevated white blood cell count, unspecified: Secondary | ICD-10-CM

## 2020-05-15 DIAGNOSIS — T07XXXA Unspecified multiple injuries, initial encounter: Secondary | ICD-10-CM

## 2020-05-15 DIAGNOSIS — R7309 Other abnormal glucose: Secondary | ICD-10-CM

## 2020-05-15 DIAGNOSIS — R0789 Other chest pain: Secondary | ICD-10-CM

## 2020-05-15 DIAGNOSIS — Z7689 Persons encountering health services in other specified circumstances: Secondary | ICD-10-CM

## 2020-05-15 DIAGNOSIS — R7401 Elevation of levels of liver transaminase levels: Secondary | ICD-10-CM

## 2020-05-15 MED ORDER — GABAPENTIN 300 MG PO CAPS: 300.0000 mg | ORAL_CAPSULE | Freq: Three times a day (TID) | ORAL | 1 refills | Status: DC | PRN

## 2020-05-15 MED ORDER — ACETAMINOPHEN-CODEINE #3 300-30 MG PO TABS: 1.0000 | ORAL_TABLET | Freq: Two times a day (BID) | ORAL | 0 refills | Status: AC | PRN

## 2020-05-15 MED ORDER — METHOCARBAMOL 500 MG PO TABS: 500.0000 mg | ORAL_TABLET | Freq: Four times a day (QID) | ORAL | 1 refills | Status: DC | PRN
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
## 2020-05-15 NOTE — Telephone Encounter (Signed)
Copied from CRM [id number]. Topic: General - Call Back - No Documentation >> May 15, 2020  3:52 PM Randol Kern wrote: Reason for CRM:   Ry, calling from [facility] on [address] is requesting a call back from clinic regarding Tylenol 3. The patient was just prescribed oxycodone that should last him 3-4 days. He says that he believes this should be filled on the 21st.

## 2020-05-16 ENCOUNTER — Encounter: Payer: Self-pay | Admitting: Nurse Practitioner

## 2020-05-19 NOTE — Telephone Encounter (Signed)
Spoke to pharmacy. Pharmacy stated they are able to fill the Tylenol 3 for patient as in today.

## 2020-05-21 ENCOUNTER — Other Ambulatory Visit: Payer: Self-pay

## 2020-05-22 ENCOUNTER — Other Ambulatory Visit: Payer: Self-pay

## 2020-06-02 ENCOUNTER — Inpatient Hospital Stay: Admission: RE | Admit: 2020-06-02 | Payer: Self-pay | Source: Ambulatory Visit

## 2020-06-08 ENCOUNTER — Other Ambulatory Visit: Payer: Self-pay | Admitting: Vascular Surgery

## 2020-06-08 DIAGNOSIS — R0789 Other chest pain: Secondary | ICD-10-CM

## 2020-06-12 ENCOUNTER — Encounter: Payer: Self-pay | Admitting: Vascular Surgery

## 2020-06-23 ENCOUNTER — Ambulatory Visit
Admission: RE | Admit: 2020-06-23 | Discharge: 2020-06-23 | Disposition: A | Discharge status: Home / Self Care | Payer: Self-pay | Source: Ambulatory Visit | Attending: Vascular Surgery | Admitting: Vascular Surgery

## 2020-06-23 ENCOUNTER — Other Ambulatory Visit: Payer: Self-pay | Admitting: Vascular Surgery

## 2020-06-23 DIAGNOSIS — R0789 Other chest pain: Secondary | ICD-10-CM

## 2020-06-23 MED ORDER — IOPAMIDOL (ISOVUE-370) INJECTION 76%
75.0000 mL | Freq: Once | INTRAVENOUS | Status: AC | PRN
  Administered 2020-06-23: 75 mL via INTRAVENOUS

## 2020-06-26 ENCOUNTER — Encounter: Payer: Self-pay | Admitting: Vascular Surgery

## 2020-06-26 ENCOUNTER — Ambulatory Visit (INDEPENDENT_AMBULATORY_CARE_PROVIDER_SITE_OTHER): Payer: Self-pay | Admitting: Vascular Surgery

## 2020-06-26 ENCOUNTER — Other Ambulatory Visit: Payer: Self-pay

## 2020-06-26 VITALS — BP 133/88 | HR 88 | Temp 98.2°F | Resp 20 | Ht 67.0 in | Wt 160.0 lb

## 2020-06-26 DIAGNOSIS — I719 Aortic aneurysm of unspecified site, without rupture: Secondary | ICD-10-CM
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
## 2020-07-01 ENCOUNTER — Encounter: Payer: Self-pay | Admitting: Nurse Practitioner

## 2020-07-09 ENCOUNTER — Other Ambulatory Visit: Payer: Self-pay | Admitting: Student

## 2020-07-09 DIAGNOSIS — M62519 Muscle wasting and atrophy, not elsewhere classified, unspecified shoulder: Secondary | ICD-10-CM

## 2020-07-18 ENCOUNTER — Other Ambulatory Visit: Payer: Self-pay

## 2020-07-19 ENCOUNTER — Other Ambulatory Visit: Payer: Self-pay

## 2020-07-19 ENCOUNTER — Ambulatory Visit
Admission: RE | Admit: 2020-07-19 | Discharge: 2020-07-19 | Disposition: A | Discharge status: Home / Self Care | Payer: Self-pay | Source: Ambulatory Visit | Attending: Student | Admitting: Student

## 2020-07-19 DIAGNOSIS — M62519 Muscle wasting and atrophy, not elsewhere classified, unspecified shoulder: Secondary | ICD-10-CM

## 2020-08-18 ENCOUNTER — Other Ambulatory Visit (HOSPITAL_COMMUNITY): Payer: Self-pay | Admitting: Orthopaedic Surgery

## 2020-08-18 DIAGNOSIS — M542 Cervicalgia: Secondary | ICD-10-CM

## 2020-08-27 ENCOUNTER — Other Ambulatory Visit: Payer: Self-pay

## 2020-08-27 ENCOUNTER — Encounter: Payer: Self-pay | Admitting: Neurology

## 2020-08-27 DIAGNOSIS — R202 Paresthesia of skin: Secondary | ICD-10-CM

## 2020-09-01 ENCOUNTER — Ambulatory Visit (HOSPITAL_COMMUNITY): Admission: RE | Admit: 2020-09-01 | Payer: Self-pay | Source: Ambulatory Visit

## 2020-09-01 ENCOUNTER — Encounter (HOSPITAL_COMMUNITY): Payer: Self-pay

## 2020-09-13 ENCOUNTER — Ambulatory Visit (HOSPITAL_COMMUNITY): Admission: RE | Admit: 2020-09-13 | Payer: Self-pay | Source: Ambulatory Visit

## 2020-10-06 ENCOUNTER — Ambulatory Visit (INDEPENDENT_AMBULATORY_CARE_PROVIDER_SITE_OTHER): Payer: Self-pay | Admitting: Neurology

## 2020-10-06 ENCOUNTER — Other Ambulatory Visit: Payer: Self-pay

## 2020-10-06 DIAGNOSIS — G5622 Lesion of ulnar nerve, left upper limb: Secondary | ICD-10-CM

## 2020-10-06 DIAGNOSIS — R202 Paresthesia of skin: Secondary | ICD-10-CM

## 2020-10-06 DIAGNOSIS — M5412 Radiculopathy, cervical region: Secondary | ICD-10-CM

## 2020-10-06 DIAGNOSIS — G5601 Carpal tunnel syndrome, right upper limb: Secondary | ICD-10-CM
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
## 2020-11-16 ENCOUNTER — Other Ambulatory Visit: Payer: Self-pay

## 2021-02-03 ENCOUNTER — Other Ambulatory Visit: Payer: Self-pay | Admitting: Neurosurgery

## 2021-02-03 ENCOUNTER — Other Ambulatory Visit (HOSPITAL_COMMUNITY): Payer: Self-pay | Admitting: Neurosurgery

## 2021-02-03 DIAGNOSIS — M5412 Radiculopathy, cervical region: Secondary | ICD-10-CM

## 2021-02-22 ENCOUNTER — Other Ambulatory Visit: Payer: Self-pay

## 2021-02-22 ENCOUNTER — Ambulatory Visit (HOSPITAL_COMMUNITY)
Admission: RE | Admit: 2021-02-22 | Discharge: 2021-02-22 | Disposition: A | Discharge status: Home / Self Care | Payer: Self-pay | Source: Ambulatory Visit | Attending: Neurosurgery | Admitting: Neurosurgery

## 2021-02-22 DIAGNOSIS — M5412 Radiculopathy, cervical region: Secondary | ICD-10-CM | POA: Insufficient documentation

## 2021-09-24 ENCOUNTER — Inpatient Hospital Stay (HOSPITAL_COMMUNITY)
Admission: EM | Admit: 2021-09-24 | Discharge: 2021-09-26 | DRG: 683 | Disposition: A | Discharge status: Home / Self Care | Payer: Medicaid Other | Attending: Internal Medicine | Admitting: Internal Medicine

## 2021-09-24 ENCOUNTER — Other Ambulatory Visit: Payer: Self-pay

## 2021-09-24 ENCOUNTER — Encounter (HOSPITAL_COMMUNITY): Payer: Self-pay

## 2021-09-24 DIAGNOSIS — F419 Anxiety disorder, unspecified: Secondary | ICD-10-CM | POA: Diagnosis present

## 2021-09-24 DIAGNOSIS — E8729 Other acidosis: Secondary | ICD-10-CM | POA: Diagnosis present

## 2021-09-24 DIAGNOSIS — I959 Hypotension, unspecified: Secondary | ICD-10-CM | POA: Diagnosis present

## 2021-09-24 DIAGNOSIS — R112 Nausea with vomiting, unspecified: Secondary | ICD-10-CM

## 2021-09-24 DIAGNOSIS — E876 Hypokalemia: Secondary | ICD-10-CM

## 2021-09-24 DIAGNOSIS — E860 Dehydration: Secondary | ICD-10-CM | POA: Diagnosis present

## 2021-09-24 DIAGNOSIS — T675XXA Heat exhaustion, unspecified, initial encounter: Secondary | ICD-10-CM | POA: Diagnosis present

## 2021-09-24 DIAGNOSIS — X30XXXA Exposure to excessive natural heat, initial encounter: Secondary | ICD-10-CM

## 2021-09-24 DIAGNOSIS — F1010 Alcohol abuse, uncomplicated: Secondary | ICD-10-CM | POA: Diagnosis present

## 2021-09-24 DIAGNOSIS — F319 Bipolar disorder, unspecified: Secondary | ICD-10-CM | POA: Diagnosis present

## 2021-09-24 DIAGNOSIS — F1410 Cocaine abuse, uncomplicated: Secondary | ICD-10-CM | POA: Diagnosis present

## 2021-09-24 DIAGNOSIS — F17210 Nicotine dependence, cigarettes, uncomplicated: Secondary | ICD-10-CM | POA: Diagnosis present

## 2021-09-24 DIAGNOSIS — E872 Acidosis, unspecified: Secondary | ICD-10-CM

## 2021-09-24 DIAGNOSIS — M6282 Rhabdomyolysis: Secondary | ICD-10-CM | POA: Diagnosis present

## 2021-09-24 DIAGNOSIS — F40240 Claustrophobia: Secondary | ICD-10-CM | POA: Diagnosis present

## 2021-09-24 DIAGNOSIS — F1210 Cannabis abuse, uncomplicated: Secondary | ICD-10-CM | POA: Diagnosis present

## 2021-09-24 DIAGNOSIS — F4310 Post-traumatic stress disorder, unspecified: Secondary | ICD-10-CM | POA: Diagnosis present

## 2021-09-24 DIAGNOSIS — D72829 Elevated white blood cell count, unspecified: Secondary | ICD-10-CM | POA: Diagnosis present

## 2021-09-24 DIAGNOSIS — N179 Acute kidney failure, unspecified: Principal | ICD-10-CM | POA: Diagnosis present

## 2021-09-24 DIAGNOSIS — Z720 Tobacco use: Secondary | ICD-10-CM

## 2021-09-24 DIAGNOSIS — Z560 Unemployment, unspecified: Secondary | ICD-10-CM

## 2021-09-24 DIAGNOSIS — D751 Secondary polycythemia: Secondary | ICD-10-CM

## 2021-09-24 HISTORY — DX: Other acidosis: E87.29

## 2021-09-24 HISTORY — DX: Rhabdomyolysis: M62.82

## 2021-09-24 LAB — CK: Total CK: 427 U/L — ABNORMAL HIGH (ref 49–397)

## 2021-09-24 LAB — CBC
HCT: 43.6 % (ref 39.0–52.0)
Hemoglobin: 15 g/dL (ref 13.0–17.0)
MCH: 31.8 pg (ref 26.0–34.0)
MCHC: 34.4 g/dL (ref 30.0–36.0)
MCV: 92.6 fL (ref 80.0–100.0)
Platelets: 298 10*3/uL (ref 150–400)
RBC: 4.71 MIL/uL (ref 4.22–5.81)
RDW: 12.8 % (ref 11.5–15.5)
WBC: 12.8 10*3/uL — ABNORMAL HIGH (ref 4.0–10.5)
nRBC: 0 % (ref 0.0–0.2)

## 2021-09-24 LAB — CBC WITH DIFFERENTIAL/PLATELET
Abs Immature Granulocytes: 0.06 10*3/uL (ref 0.00–0.07)
Basophils Absolute: 0 10*3/uL (ref 0.0–0.1)
Basophils Relative: 0 %
Eosinophils Absolute: 0 10*3/uL (ref 0.0–0.5)
Eosinophils Relative: 0 %
HCT: 50.1 % (ref 39.0–52.0)
Hemoglobin: 17.7 g/dL — ABNORMAL HIGH (ref 13.0–17.0)
Immature Granulocytes: 0 %
Lymphocytes Relative: 16 %
Lymphs Abs: 2.3 10*3/uL (ref 0.7–4.0)
MCH: 32.3 pg (ref 26.0–34.0)
MCHC: 35.3 g/dL (ref 30.0–36.0)
MCV: 91.4 fL (ref 80.0–100.0)
Monocytes Absolute: 1.7 10*3/uL — ABNORMAL HIGH (ref 0.1–1.0)
Monocytes Relative: 12 %
Neutro Abs: 10.3 10*3/uL — ABNORMAL HIGH (ref 1.7–7.7)
Neutrophils Relative %: 72 %
Platelets: 370 10*3/uL (ref 150–400)
RBC: 5.48 MIL/uL (ref 4.22–5.81)
RDW: 12.6 % (ref 11.5–15.5)
WBC: 14.4 10*3/uL — ABNORMAL HIGH (ref 4.0–10.5)
nRBC: 0 % (ref 0.0–0.2)

## 2021-09-24 LAB — RAPID URINE DRUG SCREEN, HOSP PERFORMED
Amphetamines: POSITIVE — AB
Barbiturates: NOT DETECTED
Benzodiazepines: NOT DETECTED
Cocaine: NOT DETECTED
Opiates: NOT DETECTED
Tetrahydrocannabinol: POSITIVE — AB

## 2021-09-24 LAB — URINALYSIS, ROUTINE W REFLEX MICROSCOPIC
Bilirubin Urine: NEGATIVE
Glucose, UA: NEGATIVE mg/dL
Ketones, ur: NEGATIVE mg/dL
Leukocytes,Ua: NEGATIVE
Nitrite: NEGATIVE
Protein, ur: 100 mg/dL — AB
Specific Gravity, Urine: 1.014 (ref 1.005–1.030)
pH: 5 (ref 5.0–8.0)

## 2021-09-24 LAB — BASIC METABOLIC PANEL
Anion gap: 13 (ref 5–15)
BUN: 55 mg/dL — ABNORMAL HIGH (ref 6–20)
CO2: 22 mmol/L (ref 22–32)
Calcium: 8 mg/dL — ABNORMAL LOW (ref 8.9–10.3)
Chloride: 104 mmol/L (ref 98–111)
Creatinine, Ser: 4.41 mg/dL — ABNORMAL HIGH (ref 0.61–1.24)
GFR, Estimated: 17 mL/min — ABNORMAL LOW (ref >60)
Glucose, Bld: 105 mg/dL — ABNORMAL HIGH (ref 70–99)
Potassium: 3.4 mmol/L — ABNORMAL LOW (ref 3.5–5.1)
Sodium: 139 mmol/L (ref 135–145)

## 2021-09-24 LAB — COMPREHENSIVE METABOLIC PANEL
ALT: 20 U/L (ref 0–44)
AST: 24 U/L (ref 15–41)
Albumin: 5.3 g/dL — ABNORMAL HIGH (ref 3.5–5.0)
Alkaline Phosphatase: 65 U/L (ref 38–126)
Anion gap: 20 — ABNORMAL HIGH (ref 5–15)
BUN: 62 mg/dL — ABNORMAL HIGH (ref 6–20)
CO2: 21 mmol/L — ABNORMAL LOW (ref 22–32)
Calcium: 9.7 mg/dL (ref 8.9–10.3)
Chloride: 98 mmol/L (ref 98–111)
Creatinine, Ser: 5.8 mg/dL — ABNORMAL HIGH (ref 0.61–1.24)
GFR, Estimated: 12 mL/min — ABNORMAL LOW (ref >60)
Glucose, Bld: 116 mg/dL — ABNORMAL HIGH (ref 70–99)
Potassium: 3.9 mmol/L (ref 3.5–5.1)
Sodium: 139 mmol/L (ref 135–145)
Total Bilirubin: 1 mg/dL (ref 0.3–1.2)
Total Protein: 9.3 g/dL — ABNORMAL HIGH (ref 6.5–8.1)

## 2021-09-24 LAB — PHOSPHORUS: Phosphorus: 6 mg/dL — ABNORMAL HIGH (ref 2.5–4.6)

## 2021-09-24 LAB — HIV ANTIBODY (ROUTINE TESTING W REFLEX): HIV Screen 4th Generation wRfx: NONREACTIVE

## 2021-09-24 LAB — LACTIC ACID, PLASMA
Lactic Acid, Venous: 1.1 mmol/L (ref 0.5–1.9)
Lactic Acid, Venous: 2.6 mmol/L (ref 0.5–1.9)

## 2021-09-24 LAB — MAGNESIUM: Magnesium: 2.3 mg/dL (ref 1.7–2.4)

## 2021-09-24 MED ORDER — SODIUM CHLORIDE 0.9 % IV BOLUS
1000.0000 mL | Freq: Once | INTRAVENOUS | Status: AC
  Administered 2021-09-24: 1000 mL via INTRAVENOUS

## 2021-09-24 MED ORDER — LACTATED RINGERS IV BOLUS
500.0000 mL | Freq: Once | INTRAVENOUS | Status: AC
  Administered 2021-09-25: 500 mL via INTRAVENOUS

## 2021-09-24 MED ORDER — NICOTINE 14 MG/24HR TD PT24
14.0000 mg | MEDICATED_PATCH | Freq: Every day | TRANSDERMAL | Status: DC
  Administered 2021-09-24 – 2021-09-26 (×3): 14 mg via TRANSDERMAL
  Filled 2021-09-24 (×3): qty 1

## 2021-09-24 MED ORDER — ACETAMINOPHEN 650 MG RE SUPP: 650.0000 mg | Freq: Four times a day (QID) | RECTAL | Status: DC | PRN

## 2021-09-24 MED ORDER — SODIUM CHLORIDE 0.9 % IV BOLUS: 1000.0000 mL | Freq: Once | INTRAVENOUS | Status: DC

## 2021-09-24 MED ORDER — HEPARIN SODIUM (PORCINE) 5000 UNIT/ML IJ SOLN
5000.0000 [IU] | Freq: Three times a day (TID) | INTRAMUSCULAR | Status: DC
  Administered 2021-09-24 – 2021-09-26 (×5): 5000 [IU] via SUBCUTANEOUS
  Filled 2021-09-24 (×5): qty 1

## 2021-09-24 MED ORDER — ONDANSETRON HCL 4 MG/2ML IJ SOLN: 4.0000 mg | Freq: Four times a day (QID) | INTRAMUSCULAR | Status: DC | PRN

## 2021-09-24 MED ORDER — ALUM & MAG HYDROXIDE-SIMETH 200-200-20 MG/5ML PO SUSP
30.0000 mL | ORAL | Status: DC | PRN
  Administered 2021-09-24: 30 mL via ORAL
  Filled 2021-09-24: qty 30

## 2021-09-24 MED ORDER — ACETAMINOPHEN 325 MG PO TABS: 650.0000 mg | ORAL_TABLET | Freq: Four times a day (QID) | ORAL | Status: DC | PRN

## 2021-09-24 MED ORDER — SODIUM CHLORIDE 0.9 % IV SOLN: INTRAVENOUS | Status: DC

## 2021-09-24 MED ORDER — ONDANSETRON HCL 4 MG PO TABS: 4.0000 mg | ORAL_TABLET | Freq: Four times a day (QID) | ORAL | Status: DC | PRN

## 2021-09-24 NOTE — ED Triage Notes (Addendum)
Pt arrived via EMS, has been not working x1 year, went to help friend roofing x3 hrs. Started with full body cramping.    18 G R AC 1000 cc NS given PTA

## 2021-09-24 NOTE — Plan of Care (Signed)
Plan of care reviewed and discussed with the patient. 

## 2021-09-24 NOTE — ED Provider Notes (Signed)
[facility]-EMERGENCY DEPT Provider Note   CSN: [id number] Arrival date & time: 09/24/21  1435     History  Chief Complaint  Patient presents with   Dehydration    Nicholas Oehlert is a 31 y.o. male.  Patient presents to the hospital complaining of cramps, nausea, vomiting, abdominal pain secondary to heat exposure.  Patient states that he has been unemployed for close to a year.  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  Patient states he has tried to drink electrolyte drinks at home but is vomited them up today.  Patient complains of severe muscle cramping throughout his body.  He denies shortness of breath at this time.  Past medical history significant for bipolar 1 disorder, PTSD, depression, anxiety  HPI     Home Medications Prior to Admission medications   Medication Sig Start Date End Date Taking? Authorizing Provider  Fructose-Dextrose-Phosphor Acd (NAUSEA CONTROL PO) Take 2 tablets by mouth daily as needed (nausea & vomiting).   Yes [provider]  gabapentin (NEURONTIN) 300 MG capsule Take 1 capsule (300 mg total) by mouth 3 (three) times daily as needed for up to 30 doses. 05/15/20  Yes Claiborne Rigg, NP  methocarbamol (ROBAXIN) 500 MG tablet Take 1 tablet (500 mg total) by mouth every 6 (six) hours as needed for muscle spasms. 05/15/20  Yes Claiborne Rigg, NP  cholecalciferol (VITAMIN D) 25 MCG tablet Take 2 tablets (2,000 Units total) by mouth 2 (two) times daily. Patient not taking: Reported on 09/24/2021 04/24/20   Barnetta Chapel, PA-C      Allergies    Patient has no known allergies.    Review of Systems   Review of Systems  Constitutional:  Negative for fever.  Respiratory:  Negative for shortness of breath.   Cardiovascular:  Negative for chest pain.  Gastrointestinal:  Positive for abdominal pain, nausea and vomiting.  Musculoskeletal:        Widespread cramping  Neurological:  Negative for dizziness,  light-headedness and headaches.    Physical Exam Updated Vital Signs BP (!) 131/108   Pulse 70   Temp (!) 97.4 F (36.3 C) (Axillary)   Resp 18   SpO2 100%  Physical Exam Vitals and nursing note reviewed.  Constitutional:      Appearance: He is normal weight.  HENT:     Head: Normocephalic and atraumatic.     Mouth/Throat:     Mouth: Mucous membranes are moist.  Eyes:     Conjunctiva/sclera: Conjunctivae normal.  Cardiovascular:     Rate and Rhythm: Normal rate and regular rhythm.     Pulses: Normal pulses.     Heart sounds: Normal heart sounds.  Pulmonary:     Effort: Pulmonary effort is normal.     Breath sounds: Normal breath sounds.  Abdominal:     Palpations: Abdomen is soft.     Tenderness: There is no abdominal tenderness.  Musculoskeletal:        General: No swelling or tenderness.     Cervical back: Normal range of motion and neck supple.     Right lower leg: No edema.     Left lower leg: No edema.     Comments: Muscles can be seen cramping  Skin:    General: Skin is warm.     Capillary Refill: Capillary refill takes less than 2 seconds.  Neurological:     Mental Status: He is alert and oriented to person, place, and  time.     ED Results / Procedures / Treatments   Labs (all labs ordered are listed, but only abnormal results are displayed) Labs Reviewed  COMPREHENSIVE METABOLIC PANEL - Abnormal; Notable for the following components:      Result Value   CO2 21 (*)    Glucose, Bld 116 (*)    BUN 62 (*)    Creatinine, Ser 5.80 (*)    Total Protein 9.3 (*)    Albumin 5.3 (*)    GFR, Estimated 12 (*)    Anion gap 20 (*)    All other components within normal limits  CBC WITH DIFFERENTIAL/PLATELET - Abnormal; Notable for the following components:   WBC 14.4 (*)    Hemoglobin 17.7 (*)    Neutro Abs 10.3 (*)    Monocytes Absolute 1.7 (*)    All other components within normal limits  CK - Abnormal; Notable for the following components:   Total CK 427  (*)    All other components within normal limits  URINALYSIS, ROUTINE W REFLEX MICROSCOPIC  HIV ANTIBODY (ROUTINE TESTING W REFLEX)  CBC  CREATININE, SERUM  MAGNESIUM  PHOSPHORUS    EKG EKG Interpretation  Date/Time:  Friday September 24 2021 14:51:34 EDT Ventricular Rate:  90 PR Interval:  129 QRS Duration: 105 QT Interval:  356 QTC Calculation: 436 R Axis:   93 Text Interpretation: Sinus rhythm Right atrial enlargement Borderline right axis deviation Borderline repolarization abnormality Borderline ST elevation, anterior leads since last tracing no significant change Confirmed by Mancel Bale [phone]) on 09/24/2021 2:57:28 PM  Radiology No results found.  Procedures Procedures    Medications Ordered in ED Medications  heparin injection 5,000 Units (has no administration in time range)  0.9 %  sodium chloride infusion (has no administration in time range)  acetaminophen (TYLENOL) tablet 650 mg (has no administration in time range)    Or  acetaminophen (TYLENOL) suppository 650 mg (has no administration in time range)  ondansetron (ZOFRAN) tablet 4 mg (has no administration in time range)    Or  ondansetron (ZOFRAN) injection 4 mg (has no administration in time range)  sodium chloride 0.9 % bolus 1,000 mL (has no administration in time range)  sodium chloride 0.9 % bolus 1,000 mL (1,000 mLs Intravenous New Bag/Given 09/24/21 1521)    ED Course/ Medical Decision Making/ A&P                           Medical Decision Making Amount and/or Complexity of Data Reviewed Labs: ordered.   This patient presents to the ED for concern of muscle cramps, nausea, vomiting, this involves an extensive number of treatment options, and is a complaint that carries with it a high risk of complications and morbidity.  The differential diagnosis includes dehydration from heat, electrolyte abnormalities, and others   Co morbidities that complicate the patient  evaluation  None   Additional history obtained:  Additional history obtained from EMS    Lab Tests:  I Ordered, and personally interpreted labs.  The pertinent results include: Creatinine 5.8, WBC 14.4, CK 427   Cardiac Monitoring: / EKG:  The patient was maintained on a cardiac monitor.  I personally viewed and interpreted the cardiac monitored which showed an underlying rhythm of: Sinus rhythm   Consultations Obtained:  I requested consultation with the hospitalist, Dr. Jacqulyn Bath and discussed lab and imaging findings as well as pertinent plan - they recommend: admission   Problem  List / ED Course / Critical interventions / Medication management   I ordered medication including saline and lactated ringers  for dehydration  Reevaluation of the patient after these medicines showed that the patient stayed the same I have reviewed the patients home medicines and have made adjustments as needed   Test / Admission - Considered:  The patient has a severe AKI from dehydration.  They will need admission for further fluid administration and monitoring of kidney function. Admit to hospital        Final Clinical Impression(s) / ED Diagnoses Final diagnoses:  AKI (acute kidney injury) (HCC)  Dehydration  Nausea and vomiting, unspecified vomiting type    Rx / DC Orders ED Discharge Orders     None         Pamala Duffel 09/24/21 1632    Pricilla Loveless, MD 09/25/21 [phone]

## 2021-09-24 NOTE — ED Provider Triage Note (Signed)
Emergency Medicine Provider Triage Evaluation Note  Nicholas Lopez , a 31 y.o. male  was evaluated in triage.  Pt complains of full body cramping.  Patient has been out of work for close to a year.  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  Today patient has nausea, vomiting, full body cramping which is visible.   Review of Systems  Positive: Cramps, nausea, vomiting, abdominal pain Negative: Shortness of breath  Physical Exam  BP (!) 138/103 (BP Location: Left Arm)   Pulse 91   Temp (!) 97.4 F (36.3 C) (Axillary)   Resp 18   SpO2 100%  Gen:   Awake, patient appears uncomfortable Resp:  Normal effort  MSK:   Moves extremities without difficulty  Other:  Patient visibly cramping  Medical Decision Making  Medically screening exam initiated at 2:42 PM.  Appropriate orders placed.  Hogan Hoobler was informed that the remainder of the evaluation will be completed by another provider, this initial triage assessment does not replace that evaluation, and the importance of remaining in the ED until their evaluation is complete.     Darrick Grinder, PA-C 09/24/21 1445
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
## 2021-09-24 NOTE — Progress Notes (Signed)
Date and time results received: 09/24/21 2115 (use smartphrase ".now" to insert current time)  Test: Lactic Acid Critical Value: 2.6  Name of Provider Notified: Odie Sera, MD  Orders Received? Or Actions Taken?: Awaiting response.

## 2021-09-25 DIAGNOSIS — D751 Secondary polycythemia: Secondary | ICD-10-CM

## 2021-09-25 DIAGNOSIS — E872 Acidosis, unspecified: Secondary | ICD-10-CM

## 2021-09-25 DIAGNOSIS — Z720 Tobacco use: Secondary | ICD-10-CM

## 2021-09-25 DIAGNOSIS — E876 Hypokalemia: Secondary | ICD-10-CM

## 2021-09-25 HISTORY — DX: Hypokalemia: E87.6

## 2021-09-25 HISTORY — DX: Acidosis, unspecified: E87.20

## 2021-09-25 HISTORY — DX: Secondary polycythemia: D75.1

## 2021-09-25 LAB — COMPREHENSIVE METABOLIC PANEL
ALT: 18 U/L (ref 0–44)
AST: 26 U/L (ref 15–41)
Albumin: 3.5 g/dL (ref 3.5–5.0)
Alkaline Phosphatase: 44 U/L (ref 38–126)
Anion gap: 8 (ref 5–15)
BUN: 40 mg/dL — ABNORMAL HIGH (ref 6–20)
CO2: 24 mmol/L (ref 22–32)
Calcium: 8.3 mg/dL — ABNORMAL LOW (ref 8.9–10.3)
Chloride: 107 mmol/L (ref 98–111)
Creatinine, Ser: 1.88 mg/dL — ABNORMAL HIGH (ref 0.61–1.24)
GFR, Estimated: 48 mL/min — ABNORMAL LOW (ref >60)
Glucose, Bld: 103 mg/dL — ABNORMAL HIGH (ref 70–99)
Potassium: 3 mmol/L — ABNORMAL LOW (ref 3.5–5.1)
Sodium: 139 mmol/L (ref 135–145)
Total Bilirubin: 0.5 mg/dL (ref 0.3–1.2)
Total Protein: 6.3 g/dL — ABNORMAL LOW (ref 6.5–8.1)

## 2021-09-25 LAB — CBC
HCT: 39.1 % (ref 39.0–52.0)
Hemoglobin: 13.5 g/dL (ref 13.0–17.0)
MCH: 32.2 pg (ref 26.0–34.0)
MCHC: 34.5 g/dL (ref 30.0–36.0)
MCV: 93.3 fL (ref 80.0–100.0)
Platelets: 258 10*3/uL (ref 150–400)
RBC: 4.19 MIL/uL — ABNORMAL LOW (ref 4.22–5.81)
RDW: 12.5 % (ref 11.5–15.5)
WBC: 9.3 10*3/uL (ref 4.0–10.5)
nRBC: 0 % (ref 0.0–0.2)

## 2021-09-25 LAB — C DIFFICILE QUICK SCREEN W PCR REFLEX
C Diff antigen: NEGATIVE
C Diff interpretation: NOT DETECTED
C Diff toxin: NEGATIVE

## 2021-09-25 LAB — LACTIC ACID, PLASMA: Lactic Acid, Venous: 1.1 mmol/L (ref 0.5–1.9)

## 2021-09-25 LAB — CK: Total CK: 521 U/L — ABNORMAL HIGH (ref 49–397)

## 2021-09-25 MED ORDER — POTASSIUM CHLORIDE CRYS ER 20 MEQ PO TBCR
40.0000 meq | EXTENDED_RELEASE_TABLET | Freq: Once | ORAL | Status: AC
  Administered 2021-09-25: 40 meq via ORAL
  Filled 2021-09-25: qty 2
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
## 2021-09-25 NOTE — Progress Notes (Signed)
After several episodes of diarrhea today pt reports having a normal bowel movement.
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
## 2021-09-26 LAB — BASIC METABOLIC PANEL
Anion gap: 5 (ref 5–15)
BUN: 22 mg/dL — ABNORMAL HIGH (ref 6–20)
CO2: 25 mmol/L (ref 22–32)
Calcium: 8.3 mg/dL — ABNORMAL LOW (ref 8.9–10.3)
Chloride: 109 mmol/L (ref 98–111)
Creatinine, Ser: 0.81 mg/dL (ref 0.61–1.24)
GFR, Estimated: >60 mL/min (ref >60)
Glucose, Bld: 91 mg/dL (ref 70–99)
Potassium: 3.4 mmol/L — ABNORMAL LOW (ref 3.5–5.1)
Sodium: 139 mmol/L (ref 135–145)

## 2021-09-26 LAB — CK: Total CK: 284 U/L (ref 49–397)

## 2021-09-26 MED ORDER — POTASSIUM CHLORIDE CRYS ER 20 MEQ PO TBCR
40.0000 meq | EXTENDED_RELEASE_TABLET | Freq: Once | ORAL | Status: AC
  Administered 2021-09-26: 40 meq via ORAL
  Filled 2021-09-26: qty 2

## 2021-09-26 NOTE — Plan of Care (Signed)
  Problem: Clinical Measurements: Goal: Diagnostic test results will improve Outcome: Progressing   Problem: Clinical Measurements: Goal: Respiratory complications will improve Outcome: Progressing   Problem: Clinical Measurements: Goal: Cardiovascular complication will be avoided Outcome: Progressing   Problem: Pain Managment: Goal: General experience of comfort will improve Outcome: Progressing   Problem: Safety: Goal: Ability to remain free from injury will improve Outcome: Progressing   

## 2021-09-26 NOTE — Plan of Care (Signed)
  Problem: Coping: Goal: Level of anxiety will decrease Outcome: Progressing   Problem: Pain Managment: Goal: General experience of comfort will improve Outcome: Progressing   

## 2021-09-26 NOTE — Plan of Care (Signed)
Pt to d/c home on stable condition. No needs at time of d/c instructions and education.
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
## 2021-09-27 ENCOUNTER — Telehealth: Payer: Self-pay

## 2021-09-27 LAB — GASTROINTESTINAL PANEL BY PCR, STOOL (REPLACES STOOL CULTURE)

## 2021-09-27 NOTE — Telephone Encounter (Signed)
Transition Care Management Unsuccessful Follow-up Telephone Call  Date of discharge and from where:  09/26/2021, [facility]  Attempts:  1st Attempt  Reason for unsuccessful TCM follow-up call:  Left voice message on # [phone], call back requested. Call also placed to # [phone]  and the recording stated that the number is not in service.

## 2021-09-29 ENCOUNTER — Telehealth: Payer: Self-pay

## 2021-09-29 NOTE — Telephone Encounter (Signed)
Transition Care Management Unsuccessful Follow-up Telephone Call  Date of discharge and from where:  09/26/2021, [facility]  Attempts:  2nd Attempt  Reason for unsuccessful TCM follow-up call:  Left voice message on # [phone], call back requested. Call also placed to # [phone]  and the recording stated that the number is not in service.

## 2021-09-30 ENCOUNTER — Telehealth: Payer: Self-pay

## 2021-09-30 NOTE — Telephone Encounter (Signed)
Transition Care Management Unsuccessful Follow-up Telephone Call  Date of discharge and from where:  09/26/2021, [facility]  Attempts:  3rd Attempt  Reason for unsuccessful TCM follow-up call:  Left voice message on # [phone], call back requested. Call also placed to # [phone]  and the recording stated that the number is not in service.    Letter also sent to patient requesting he contact [facility] to schedule a follow up appointment as we have not been able to reach him.

## 2021-12-29 ENCOUNTER — Ambulatory Visit: Payer: Medicaid Other | Admitting: Nurse Practitioner
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
## 2022-06-13 ENCOUNTER — Encounter (HOSPITAL_COMMUNITY): Payer: Self-pay | Admitting: Emergency Medicine

## 2022-06-13 ENCOUNTER — Emergency Department (HOSPITAL_COMMUNITY)
Admission: EM | Admit: 2022-06-13 | Discharge: 2022-06-13 | Disposition: A | Discharge status: Home / Self Care | Payer: Medicaid Other | Attending: Emergency Medicine | Admitting: Emergency Medicine

## 2022-06-13 DIAGNOSIS — M2550 Pain in unspecified joint: Secondary | ICD-10-CM | POA: Diagnosis present

## 2022-06-13 MED ORDER — BLOOD PRESSURE MONITOR/M CUFF MISC: 1.0000 | Freq: Every day | 0 refills | Status: DC

## 2022-06-13 NOTE — ED Triage Notes (Signed)
Pt arriving via [facility] for intermittent episodes of hypertension with headaches for approx 1 month. Pt had dx of rhabdo approx 5 months ago. Pt does not have primary care at this time. Pt reports taking his mothers gabapentin and Vicodin for pain.

## 2022-06-13 NOTE — Discharge Instructions (Signed)
Monitor your blood pressure, record readings and take to PCP for follow-up.
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
## 2022-06-14 ENCOUNTER — Ambulatory Visit: Payer: Self-pay

## 2022-06-14 NOTE — Telephone Encounter (Signed)
Spoke with patient . Patient voices that S/S have not improved Advise to got To ED or UC. Patient declines advice. Voices he has been a they did not do anything he prefers seeing on of our PCP's Patient scheduled for an appointment at [facility] on tomorrow. Address given. Advise patient if s/s worsen to go to or ED or call 911. Patient voiced understanding of all discussed .

## 2022-06-14 NOTE — Telephone Encounter (Signed)
  Chief Complaint: Hospital follow up Symptoms: Shakiness, woozy, fatigue, possible elevated BP. Frequency: ongoing Pertinent Negatives: Patient denies  Disposition: [] ED /[] Urgent Care (no appt availability in office) / [] Appointment(In office/virtual)/ []  [facility]/ [] Home Care/ [] Refused Recommended Disposition /[] [facility]/ [x]  Follow-up with PCP Additional Notes: Pt was seen yesterday at ED for most of above issues. Pt also has some rectal bleeding, which was addressed at ED. Today pt called rather concerned about s/s not truly addressed at ED visit.  Discussed using BP monitored ordered by ED, and keeping a log for provider. To bring to office. We discussed dietary issues. PT states that he felt much better after eating. His dizziness and wooziness dissipated. Pt will try to reduce simple sugars in his diet and work towards a better diet. Pt does have a family hx of diabetes.  Pt felt much better about s/s after discussion. Pt will call back if needed. Please schedule hospital follow up.    Reason for Disposition  Requesting regular office appointment  Answer Assessment - Initial Assessment Questions 1. REASON FOR CALL or QUESTION: "What is your reason for calling today?" or "How can I best help you?" or "What question do you have that I can help answer?"     Pt very concerned about current s/s.  Protocols used: Information Only Call - No Triage-A-AH

## 2022-06-15 ENCOUNTER — Ambulatory Visit: Payer: Self-pay

## 2022-06-15 ENCOUNTER — Ambulatory Visit (INDEPENDENT_AMBULATORY_CARE_PROVIDER_SITE_OTHER): Payer: Medicaid Other | Admitting: Primary Care

## 2022-06-15 ENCOUNTER — Encounter (INDEPENDENT_AMBULATORY_CARE_PROVIDER_SITE_OTHER): Payer: Self-pay | Admitting: Primary Care

## 2022-06-15 VITALS — BP 140/96 | HR 88 | Resp 16 | Ht 67.0 in | Wt 171.8 lb

## 2022-06-15 DIAGNOSIS — Z09 Encounter for follow-up examination after completed treatment for conditions other than malignant neoplasm: Secondary | ICD-10-CM

## 2022-06-15 DIAGNOSIS — G894 Chronic pain syndrome: Secondary | ICD-10-CM

## 2022-06-15 DIAGNOSIS — I10 Essential (primary) hypertension: Secondary | ICD-10-CM

## 2022-06-15 MED ORDER — HYDROCHLOROTHIAZIDE 25 MG PO TABS: 25.0000 mg | ORAL_TABLET | Freq: Every day | ORAL | 0 refills | Status: DC

## 2022-06-15 NOTE — Patient Instructions (Signed)
Hypertension, Adult ?Hypertension is another name for high blood pressure. High blood pressure forces your heart to work harder to pump blood. This can cause problems over time. ?There are two numbers in a blood pressure reading. There is a top number (systolic) over a bottom number (diastolic). It is best to have a blood pressure that is below 120/80. ?What are the causes? ?The cause of this condition is not known. Some other conditions can lead to high blood pressure. ?What increases the risk? ?Some lifestyle factors can make you more likely to develop high blood pressure: ?Smoking. ?Not getting enough exercise or physical activity. ?Being overweight. ?Having too much fat, sugar, calories, or salt (sodium) in your diet. ?Drinking too much alcohol. ?Other risk factors include: ?Having any of these conditions: ?Heart disease. ?Diabetes. ?High cholesterol. ?Kidney disease. ?Obstructive sleep apnea. ?Having a family history of high blood pressure and high cholesterol. ?Age. The risk increases with age. ?Stress. ?What are the signs or symptoms? ?High blood pressure may not cause symptoms. Very high blood pressure (hypertensive crisis) may cause: ?Headache. ?Fast or uneven heartbeats (palpitations). ?Shortness of breath. ?Nosebleed. ?Vomiting or feeling like you may vomit (nauseous). ?Changes in how you see. ?Very bad chest pain. ?Feeling dizzy. ?Seizures. ?How is this treated? ?This condition is treated by making healthy lifestyle changes, such as: ?Eating healthy foods. ?Exercising more. ?Drinking less alcohol. ?Your doctor may prescribe medicine if lifestyle changes do not help enough and if: ?Your top number is above 130. ?Your bottom number is above 80. ?Your personal target blood pressure may vary. ?Follow these instructions at home: ?Eating and drinking ? ?If told, follow the DASH eating plan. To follow this plan: ?Fill one half of your plate at each meal with fruits and vegetables. ?Fill one fourth of your plate  at each meal with whole grains. Whole grains include whole-wheat pasta, brown rice, and whole-grain bread. ?Eat or drink low-fat dairy products, such as skim milk or low-fat yogurt. ?Fill one fourth of your plate at each meal with low-fat (lean) proteins. Low-fat proteins include fish, chicken without skin, eggs, beans, and tofu. ?Avoid fatty meat, cured and processed meat, or chicken with skin. ?Avoid pre-made or processed food. ?Limit the amount of salt in your diet to less than 1,500 mg each day. ?Do not drink alcohol if: ?Your doctor tells you not to drink. ?You are pregnant, may be pregnant, or are planning to become pregnant. ?If you drink alcohol: ?Limit how much you have to: ?0-1 drink a day for women. ?0-2 drinks a day for men. ?Know how much alcohol is in your drink. In the [place] one drink equals one 12 oz bottle of beer (355 mL), one 5 oz glass of wine (148 mL), or one 1? oz glass of hard liquor (44 mL). ?Lifestyle ? ?Work with your doctor to stay at a healthy weight or to lose weight. Ask your doctor what the best weight is for you. ?Get at least 30 minutes of exercise that causes your heart to beat faster (aerobic exercise) most days of the week. This may include walking, swimming, or biking. ?Get at least 30 minutes of exercise that strengthens your muscles (resistance exercise) at least 3 days a week. This may include lifting weights or doing Pilates. ?Do not smoke or use any products that contain nicotine or tobacco. If you need help quitting, ask your doctor. ?Check your blood pressure at home as told by your doctor. ?Keep all follow-up visits. ?Medicines ?Take over-the-counter and prescription medicines   only as told by your doctor. Follow directions carefully. ?Do not skip doses of blood pressure medicine. The medicine does not work as well if you skip doses. Skipping doses also puts you at risk for problems. ?Ask your doctor about side effects or reactions to medicines that you should watch  for. ?Contact a doctor if: ?You think you are having a reaction to the medicine you are taking. ?You have headaches that keep coming back. ?You feel dizzy. ?You have swelling in your ankles. ?You have trouble with your vision. ?Get help right away if: ?You get a very bad headache. ?You start to feel mixed up (confused). ?You feel weak or numb. ?You feel faint. ?You have very bad pain in your: ?Chest. ?Belly (abdomen). ?You vomit more than once. ?You have trouble breathing. ?These symptoms may be an emergency. Get help right away. Call 911. ?Do not wait to see if the symptoms will go away. ?Do not drive yourself to the hospital. ?Summary ?Hypertension is another name for high blood pressure. ?High blood pressure forces your heart to work harder to pump blood. ?For most people, a normal blood pressure is less than 120/80. ?Making healthy choices can help lower blood pressure. If your blood pressure does not get lower with healthy choices, you may need to take medicine. ?This information is not intended to replace advice given to you by your health care provider. Make sure you discuss any questions you have with your health care provider. ?Document Revised: 12/31/2020 Document Reviewed: 12/31/2020 ?Elsevier Patient Education ? 2023 [employer] ? ?
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
## 2022-06-15 NOTE — Telephone Encounter (Signed)
Chief Complaint: High BP Symptoms: dizziness, blurred vision, jittery on inside, fatigue Frequency: Today while walking around 4p Pertinent Negatives: Patient denies chest pain Disposition: [x] ED /[] Urgent Care (no appt availability in office) / [] Appointment(In office/virtual)/ []  [facility]/ [] Home Care/ [] Refused Recommended Disposition /[] [facility]/ []  Follow-up with PCP Additional Notes: Patient says he started taking HCTZ 25 mg today around 1130-1200 after being seen in the office this morning. He says he was out walking and was feeling jittery inside, dizzy, and blurred vision. He says he got home and checked his BP at 1614 and it was 152/91, P 124. He sat down, rechecked at 1620 158/100, P 125; recheck at 1630 BP 137/96, P 118. He says he is also tired and want to sleep when his BP is up. I advised ED for evaluation, patient verbalized understanding and says he will call EMS.   Reason for Disposition  [id number] Systolic BP  >= 0000000 OR Diastolic >= 123XX123 AND [id number] cardiac (e.g., breathing difficulty, chest pain) or neurologic symptoms (e.g., new-onset blurred or double vision, unsteady gait)  Answer Assessment - Initial Assessment Questions 1. BLOOD PRESSURE: "What is the blood pressure?" "Did you take at least two measurements 5 minutes apart?"     158/100, P 125, 137/96, P 118, 149/102 2. ONSET: "When did you take your blood pressure?"     1620 3. HOW: "How did you take your blood pressure?" (e.g., automatic home BP monitor, visiting nurse)     Home BP monitor 4. HISTORY: "Do you have a history of high blood pressure?"     Yes 5. MEDICINES: "Are you taking any medicines for blood pressure?" "Have you missed any doses recently?"     HCTZ 6. OTHER SYMPTOMS: "Do you have any symptoms?" (e.g., blurred vision, chest pain, difficulty breathing, headache, weakness)     Dizziness, blurred vision, tired  Protocols used: Blood Pressure - High-A-AH

## 2022-06-16 NOTE — Telephone Encounter (Signed)
noted 

## 2022-07-05 ENCOUNTER — Encounter: Payer: Self-pay | Admitting: Nurse Practitioner

## 2022-07-05 ENCOUNTER — Ambulatory Visit: Payer: Medicaid Other | Attending: Nurse Practitioner | Admitting: Nurse Practitioner

## 2022-07-05 ENCOUNTER — Inpatient Hospital Stay: Payer: Medicaid Other | Admitting: Nurse Practitioner

## 2022-07-05 VITALS — BP 130/89 | HR 89 | Ht 67.0 in | Wt 167.0 lb

## 2022-07-05 DIAGNOSIS — M62519 Muscle wasting and atrophy, not elsewhere classified, unspecified shoulder: Secondary | ICD-10-CM

## 2022-07-05 DIAGNOSIS — I10 Essential (primary) hypertension: Secondary | ICD-10-CM

## 2022-07-05 DIAGNOSIS — F4310 Post-traumatic stress disorder, unspecified: Secondary | ICD-10-CM

## 2022-07-05 DIAGNOSIS — F419 Anxiety disorder, unspecified: Secondary | ICD-10-CM

## 2022-07-05 DIAGNOSIS — K921 Melena: Secondary | ICD-10-CM

## 2022-07-05 DIAGNOSIS — Z87828 Personal history of other (healed) physical injury and trauma: Secondary | ICD-10-CM | POA: Diagnosis not present

## 2022-07-05 DIAGNOSIS — L720 Epidermal cyst: Secondary | ICD-10-CM

## 2022-07-05 DIAGNOSIS — I729 Aneurysm of unspecified site: Secondary | ICD-10-CM

## 2022-07-05 MED ORDER — HYDROXYZINE HCL 10 MG PO TABS: 10.0000 mg | ORAL_TABLET | Freq: Three times a day (TID) | ORAL | 3 refills | Status: DC | PRN

## 2022-07-05 MED ORDER — GABAPENTIN 300 MG PO CAPS
300.0000 mg | ORAL_CAPSULE | Freq: Three times a day (TID) | ORAL | 1 refills | Status: DC | PRN
Start: 2022-07-05 — End: 2022-07-05

## 2022-07-05 MED ORDER — GABAPENTIN 300 MG PO CAPS
300.0000 mg | ORAL_CAPSULE | Freq: Three times a day (TID) | ORAL | 1 refills | Status: DC | PRN
Start: 2022-07-05 — End: 2022-09-09

## 2022-07-05 MED ORDER — BLOOD PRESSURE MONITOR/M CUFF MISC: 1.0000 | Freq: Every day | 0 refills | Status: DC

## 2022-07-05 MED ORDER — HYDROCHLOROTHIAZIDE 25 MG PO TABS
25.0000 mg | ORAL_TABLET | Freq: Every day | ORAL | 1 refills | Status: DC
Start: 2022-07-05 — End: 2022-07-05

## 2022-07-05 MED ORDER — METHOCARBAMOL 500 MG PO TABS
500.0000 mg | ORAL_TABLET | Freq: Four times a day (QID) | ORAL | 1 refills | Status: DC | PRN
Start: 2022-07-05 — End: 2022-07-05

## 2022-07-05 MED ORDER — METHOCARBAMOL 500 MG PO TABS
500.0000 mg | ORAL_TABLET | Freq: Four times a day (QID) | ORAL | 1 refills | Status: DC | PRN
Start: 2022-07-05 — End: 2023-10-13

## 2022-07-05 MED ORDER — HYDROCHLOROTHIAZIDE 25 MG PO TABS: 25.0000 mg | ORAL_TABLET | Freq: Every day | ORAL | 1 refills | Status: DC
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
## 2022-07-05 NOTE — Progress Notes (Signed)
BP still high at home.

## 2022-07-06 LAB — CBC WITH DIFFERENTIAL/PLATELET
Basophils Absolute: 0 10*3/uL (ref 0.0–0.2)
Basos: 0 %
EOS (ABSOLUTE): 0.1 10*3/uL (ref 0.0–0.4)
Eos: 1 %
Hematocrit: 46.7 % (ref 37.5–51.0)
Hemoglobin: 15.5 g/dL (ref 13.0–17.7)
Immature Grans (Abs): 0 10*3/uL (ref 0.0–0.1)
Immature Granulocytes: 0 %
Lymphocytes Absolute: 2.6 10*3/uL (ref 0.7–3.1)
Lymphs: 36 %
MCH: 30.2 pg (ref 26.6–33.0)
MCHC: 33.2 g/dL (ref 31.5–35.7)
MCV: 91 fL (ref 79–97)
Monocytes Absolute: 0.9 10*3/uL (ref 0.1–0.9)
Monocytes: 12 %
Neutrophils Absolute: 3.5 10*3/uL (ref 1.4–7.0)
Neutrophils: 51 %
Platelets: 332 10*3/uL (ref 150–450)
RBC: 5.13 x10E6/uL (ref 4.14–5.80)
RDW: 12.8 % (ref 11.6–15.4)
WBC: 7.1 10*3/uL (ref 3.4–10.8)

## 2022-07-06 LAB — CMP14+EGFR
ALT: 37 IU/L (ref 0–44)
AST: 32 IU/L (ref 0–40)
Albumin/Globulin Ratio: 1.5 (ref 1.2–2.2)
Albumin: 4.8 g/dL (ref 4.1–5.1)
Alkaline Phosphatase: 99 IU/L (ref 44–121)
BUN/Creatinine Ratio: 20 (ref 9–20)
BUN: 18 mg/dL (ref 6–20)
Bilirubin Total: 0.3 mg/dL (ref 0.0–1.2)
CO2: 23 mmol/L (ref 20–29)
Calcium: 9.7 mg/dL (ref 8.7–10.2)
Chloride: 98 mmol/L (ref 96–106)
Creatinine, Ser: 0.88 mg/dL (ref 0.76–1.27)
Globulin, Total: 3.2 g/dL (ref 1.5–4.5)
Glucose: 104 mg/dL — ABNORMAL HIGH (ref 70–99)
Potassium: 4 mmol/L (ref 3.5–5.2)
Sodium: 139 mmol/L (ref 134–144)
Total Protein: 8 g/dL (ref 6.0–8.5)
eGFR: 116 mL/min/{1.73_m2} (ref >59)

## 2022-07-11 ENCOUNTER — Encounter: Payer: Self-pay | Admitting: Nurse Practitioner

## 2022-08-10 ENCOUNTER — Other Ambulatory Visit: Payer: Self-pay | Admitting: Nurse Practitioner

## 2022-08-10 DIAGNOSIS — M62519 Muscle wasting and atrophy, not elsewhere classified, unspecified shoulder: Secondary | ICD-10-CM

## 2022-08-15 ENCOUNTER — Inpatient Hospital Stay
Admission: EM | Admit: 2022-08-15 | Discharge: 2022-08-18 | DRG: 683 | Disposition: A | Discharge status: Home / Self Care | Payer: Medicaid Other | Attending: Internal Medicine | Admitting: Internal Medicine

## 2022-08-15 ENCOUNTER — Emergency Department: Payer: Medicaid Other

## 2022-08-15 DIAGNOSIS — F319 Bipolar disorder, unspecified: Secondary | ICD-10-CM | POA: Diagnosis present

## 2022-08-15 DIAGNOSIS — E861 Hypovolemia: Secondary | ICD-10-CM | POA: Diagnosis present

## 2022-08-15 DIAGNOSIS — N179 Acute kidney failure, unspecified: Principal | ICD-10-CM | POA: Diagnosis present

## 2022-08-15 DIAGNOSIS — I10 Essential (primary) hypertension: Secondary | ICD-10-CM | POA: Insufficient documentation

## 2022-08-15 DIAGNOSIS — E875 Hyperkalemia: Secondary | ICD-10-CM | POA: Insufficient documentation

## 2022-08-15 DIAGNOSIS — Z720 Tobacco use: Secondary | ICD-10-CM | POA: Diagnosis present

## 2022-08-15 DIAGNOSIS — Z79899 Other long term (current) drug therapy: Secondary | ICD-10-CM

## 2022-08-15 DIAGNOSIS — E860 Dehydration: Secondary | ICD-10-CM | POA: Diagnosis present

## 2022-08-15 DIAGNOSIS — F17210 Nicotine dependence, cigarettes, uncomplicated: Secondary | ICD-10-CM | POA: Diagnosis present

## 2022-08-15 DIAGNOSIS — G629 Polyneuropathy, unspecified: Secondary | ICD-10-CM | POA: Diagnosis present

## 2022-08-15 DIAGNOSIS — M6282 Rhabdomyolysis: Secondary | ICD-10-CM | POA: Diagnosis present

## 2022-08-15 DIAGNOSIS — G8929 Other chronic pain: Secondary | ICD-10-CM | POA: Diagnosis present

## 2022-08-15 DIAGNOSIS — Z79891 Long term (current) use of opiate analgesic: Secondary | ICD-10-CM

## 2022-08-15 LAB — CBC WITH DIFFERENTIAL/PLATELET
Abs Immature Granulocytes: 0.17 10*3/uL — ABNORMAL HIGH (ref 0.00–0.07)
Basophils Absolute: 0.1 10*3/uL (ref 0.0–0.1)
Basophils Relative: 0 %
Eosinophils Absolute: 0 10*3/uL (ref 0.0–0.5)
Eosinophils Relative: 0 %
HCT: 50.7 % (ref 39.0–52.0)
Hemoglobin: 16.8 g/dL (ref 13.0–17.0)
Immature Granulocytes: 1 %
Lymphocytes Relative: 6 %
Lymphs Abs: 1.5 10*3/uL (ref 0.7–4.0)
MCH: 31.2 pg (ref 26.0–34.0)
MCHC: 33.1 g/dL (ref 30.0–36.0)
MCV: 94.1 fL (ref 80.0–100.0)
Monocytes Absolute: 2.1 10*3/uL — ABNORMAL HIGH (ref 0.1–1.0)
Monocytes Relative: 8 %
Neutro Abs: 21.4 10*3/uL — ABNORMAL HIGH (ref 1.7–7.7)
Neutrophils Relative %: 85 %
Platelets: 394 10*3/uL (ref 150–400)
RBC: 5.39 MIL/uL (ref 4.22–5.81)
RDW: 13.8 % (ref 11.5–15.5)
Smear Review: NORMAL
WBC: 25.2 10*3/uL — ABNORMAL HIGH (ref 4.0–10.5)
nRBC: 0 % (ref 0.0–0.2)

## 2022-08-15 LAB — COMPREHENSIVE METABOLIC PANEL
ALT: 45 U/L — ABNORMAL HIGH (ref 0–44)
AST: 59 U/L — ABNORMAL HIGH (ref 15–41)
Albumin: 5.8 g/dL — ABNORMAL HIGH (ref 3.5–5.0)
Alkaline Phosphatase: 88 U/L (ref 38–126)
Anion gap: 22 — ABNORMAL HIGH (ref 5–15)
BUN: 43 mg/dL — ABNORMAL HIGH (ref 6–20)
CO2: 22 mmol/L (ref 22–32)
Calcium: 10 mg/dL (ref 8.9–10.3)
Chloride: 99 mmol/L (ref 98–111)
Creatinine, Ser: 4.28 mg/dL — ABNORMAL HIGH (ref 0.61–1.24)
GFR, Estimated: 18 mL/min — ABNORMAL LOW (ref >60)
Glucose, Bld: 138 mg/dL — ABNORMAL HIGH (ref 70–99)
Potassium: 5.2 mmol/L — ABNORMAL HIGH (ref 3.5–5.1)
Sodium: 143 mmol/L (ref 135–145)
Total Bilirubin: 0.9 mg/dL (ref 0.3–1.2)
Total Protein: 10.6 g/dL — ABNORMAL HIGH (ref 6.5–8.1)

## 2022-08-15 LAB — URIC ACID: Uric Acid, Serum: 7.7 mg/dL (ref 3.7–8.6)

## 2022-08-15 LAB — TROPONIN I (HIGH SENSITIVITY)
Troponin I (High Sensitivity): 136 ng/L (ref <18)
Troponin I (High Sensitivity): 68 ng/L — ABNORMAL HIGH (ref <18)

## 2022-08-15 LAB — URINALYSIS, ROUTINE W REFLEX MICROSCOPIC
Bilirubin Urine: NEGATIVE
Glucose, UA: NEGATIVE mg/dL
Ketones, ur: NEGATIVE mg/dL
Leukocytes,Ua: NEGATIVE
Nitrite: NEGATIVE
Protein, ur: 100 mg/dL — AB
Specific Gravity, Urine: 1.013 (ref 1.005–1.030)
pH: 6 (ref 5.0–8.0)

## 2022-08-15 LAB — CK: Total CK: 634 U/L — ABNORMAL HIGH (ref 49–397)

## 2022-08-15 MED ORDER — SODIUM CHLORIDE 0.9 % IV BOLUS
1000.0000 mL | Freq: Once | INTRAVENOUS | Status: AC
  Administered 2022-08-15: 1000 mL via INTRAVENOUS

## 2022-08-15 MED ORDER — OXYCODONE HCL 5 MG PO TABS
5.0000 mg | ORAL_TABLET | Freq: Once | ORAL | Status: AC
  Administered 2022-08-16: 5 mg via ORAL
  Filled 2022-08-15: qty 1

## 2022-08-15 MED ORDER — LACTATED RINGERS IV BOLUS
1000.0000 mL | Freq: Once | INTRAVENOUS | Status: AC
  Administered 2022-08-15: 1000 mL via INTRAVENOUS

## 2022-08-15 NOTE — ED Notes (Signed)
Pt attempting to urinate.
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
## 2022-08-15 NOTE — ED Triage Notes (Signed)
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 Pt endorsed some cramping, N/V. Recently transitioned from Oxycodone to Suboxone. Received NS via 16g LAC and 1mg  of Narcan without any significant change in sx.

## 2022-08-15 NOTE — ED Triage Notes (Signed)
To triage via wheelchair with c/o dizziness, syncopal episode, nausea/vomiting and cramping in extremities. Pt states he feels like he is dehydrated. C/o chest pain and shortness of breath.

## 2022-08-15 NOTE — ED Notes (Signed)
Lab reports troponin results; acuity level changed and will take pt to next available exam room

## 2022-08-16 ENCOUNTER — Other Ambulatory Visit: Payer: Self-pay

## 2022-08-16 ENCOUNTER — Encounter: Payer: Self-pay | Admitting: Internal Medicine

## 2022-08-16 DIAGNOSIS — I10 Essential (primary) hypertension: Secondary | ICD-10-CM | POA: Insufficient documentation

## 2022-08-16 DIAGNOSIS — E861 Hypovolemia: Secondary | ICD-10-CM | POA: Diagnosis present

## 2022-08-16 DIAGNOSIS — F17210 Nicotine dependence, cigarettes, uncomplicated: Secondary | ICD-10-CM | POA: Diagnosis present

## 2022-08-16 DIAGNOSIS — G8929 Other chronic pain: Secondary | ICD-10-CM | POA: Diagnosis present

## 2022-08-16 DIAGNOSIS — E875 Hyperkalemia: Secondary | ICD-10-CM

## 2022-08-16 DIAGNOSIS — E860 Dehydration: Secondary | ICD-10-CM | POA: Diagnosis present

## 2022-08-16 DIAGNOSIS — N179 Acute kidney failure, unspecified: Secondary | ICD-10-CM | POA: Diagnosis present

## 2022-08-16 DIAGNOSIS — Z79891 Long term (current) use of opiate analgesic: Secondary | ICD-10-CM

## 2022-08-16 DIAGNOSIS — Z79899 Other long term (current) drug therapy: Secondary | ICD-10-CM | POA: Diagnosis not present

## 2022-08-16 DIAGNOSIS — M6282 Rhabdomyolysis: Secondary | ICD-10-CM | POA: Diagnosis present

## 2022-08-16 DIAGNOSIS — F319 Bipolar disorder, unspecified: Secondary | ICD-10-CM | POA: Diagnosis present

## 2022-08-16 DIAGNOSIS — Z720 Tobacco use: Secondary | ICD-10-CM

## 2022-08-16 DIAGNOSIS — G629 Polyneuropathy, unspecified: Secondary | ICD-10-CM | POA: Diagnosis present

## 2022-08-16 LAB — CBC WITH DIFFERENTIAL/PLATELET
Abs Immature Granulocytes: 0.09 10*3/uL — ABNORMAL HIGH (ref 0.00–0.07)
Basophils Absolute: 0.1 10*3/uL (ref 0.0–0.1)
Basophils Relative: 0 %
Eosinophils Absolute: 0 10*3/uL (ref 0.0–0.5)
Eosinophils Relative: 0 %
HCT: 37.5 % — ABNORMAL LOW (ref 39.0–52.0)
Hemoglobin: 12.7 g/dL — ABNORMAL LOW (ref 13.0–17.0)
Immature Granulocytes: 1 %
Lymphocytes Relative: 13 %
Lymphs Abs: 2.5 10*3/uL (ref 0.7–4.0)
MCH: 31.6 pg (ref 26.0–34.0)
MCHC: 33.9 g/dL (ref 30.0–36.0)
MCV: 93.3 fL (ref 80.0–100.0)
Monocytes Absolute: 2.4 10*3/uL — ABNORMAL HIGH (ref 0.1–1.0)
Monocytes Relative: 13 %
Neutro Abs: 13.4 10*3/uL — ABNORMAL HIGH (ref 1.7–7.7)
Neutrophils Relative %: 73 %
Platelets: 281 10*3/uL (ref 150–400)
RBC: 4.02 MIL/uL — ABNORMAL LOW (ref 4.22–5.81)
RDW: 13.8 % (ref 11.5–15.5)
WBC: 18.4 10*3/uL — ABNORMAL HIGH (ref 4.0–10.5)
nRBC: 0 % (ref 0.0–0.2)

## 2022-08-16 LAB — COMPREHENSIVE METABOLIC PANEL
ALT: 32 U/L (ref 0–44)
AST: 45 U/L — ABNORMAL HIGH (ref 15–41)
Albumin: 4.1 g/dL (ref 3.5–5.0)
Alkaline Phosphatase: 61 U/L (ref 38–126)
Anion gap: 12 (ref 5–15)
BUN: 39 mg/dL — ABNORMAL HIGH (ref 6–20)
CO2: 22 mmol/L (ref 22–32)
Calcium: 8.3 mg/dL — ABNORMAL LOW (ref 8.9–10.3)
Chloride: 104 mmol/L (ref 98–111)
Creatinine, Ser: 2.23 mg/dL — ABNORMAL HIGH (ref 0.61–1.24)
GFR, Estimated: 39 mL/min — ABNORMAL LOW (ref >60)
Glucose, Bld: 119 mg/dL — ABNORMAL HIGH (ref 70–99)
Potassium: 3.9 mmol/L (ref 3.5–5.1)
Sodium: 138 mmol/L (ref 135–145)
Total Bilirubin: 0.8 mg/dL (ref 0.3–1.2)
Total Protein: 7.6 g/dL (ref 6.5–8.1)

## 2022-08-16 LAB — BASIC METABOLIC PANEL
Anion gap: 8 (ref 5–15)
BUN: 31 mg/dL — ABNORMAL HIGH (ref 6–20)
CO2: 26 mmol/L (ref 22–32)
Calcium: 8.5 mg/dL — ABNORMAL LOW (ref 8.9–10.3)
Chloride: 100 mmol/L (ref 98–111)
Creatinine, Ser: 1.3 mg/dL — ABNORMAL HIGH (ref 0.61–1.24)
GFR, Estimated: >60 mL/min (ref >60)
Glucose, Bld: 102 mg/dL — ABNORMAL HIGH (ref 70–99)
Potassium: 3.5 mmol/L (ref 3.5–5.1)
Sodium: 134 mmol/L — ABNORMAL LOW (ref 135–145)

## 2022-08-16 LAB — MAGNESIUM: Magnesium: 2.4 mg/dL (ref 1.7–2.4)

## 2022-08-16 LAB — GLUCOSE, CAPILLARY
Glucose-Capillary: 99 mg/dL (ref 70–99)
Glucose-Capillary: 100 mg/dL — ABNORMAL HIGH (ref 70–99)

## 2022-08-16 LAB — CK
Total CK: 1104 U/L — ABNORMAL HIGH (ref 49–397)
Total CK: 1651 U/L — ABNORMAL HIGH (ref 49–397)
Total CK: 1771 U/L — ABNORMAL HIGH (ref 49–397)

## 2022-08-16 MED ORDER — SODIUM CHLORIDE 0.9 % IV SOLN: INTRAVENOUS | Status: DC

## 2022-08-16 MED ORDER — ACETAMINOPHEN 325 MG PO TABS: 650.0000 mg | ORAL_TABLET | Freq: Four times a day (QID) | ORAL | Status: DC | PRN

## 2022-08-16 MED ORDER — SODIUM CHLORIDE 0.9 % IV SOLN: INTRAVENOUS | Status: AC

## 2022-08-16 MED ORDER — OXYCODONE-ACETAMINOPHEN 5-325 MG PO TABS
1.0000 | ORAL_TABLET | ORAL | Status: DC | PRN
  Administered 2022-08-16 – 2022-08-18 (×10): 1 via ORAL
  Filled 2022-08-16 (×10): qty 1

## 2022-08-16 MED ORDER — NICOTINE 14 MG/24HR TD PT24
14.0000 mg | MEDICATED_PATCH | Freq: Every day | TRANSDERMAL | Status: DC
  Administered 2022-08-16 – 2022-08-18 (×4): 14 mg via TRANSDERMAL
  Filled 2022-08-16 (×4): qty 1

## 2022-08-16 MED ORDER — OXYCODONE HCL 5 MG PO TABS
5.0000 mg | ORAL_TABLET | ORAL | Status: DC | PRN
  Administered 2022-08-16 (×2): 5 mg via ORAL
  Filled 2022-08-16 (×2): qty 1

## 2022-08-16 MED ORDER — ACETAMINOPHEN 650 MG RE SUPP: 650.0000 mg | Freq: Four times a day (QID) | RECTAL | Status: DC | PRN

## 2022-08-16 MED ORDER — HEPARIN SODIUM (PORCINE) 5000 UNIT/ML IJ SOLN
5000.0000 [IU] | Freq: Three times a day (TID) | INTRAMUSCULAR | Status: DC
  Administered 2022-08-16 – 2022-08-18 (×6): 5000 [IU] via SUBCUTANEOUS
  Filled 2022-08-16 (×6): qty 1

## 2022-08-16 MED ORDER — HEPARIN SODIUM (PORCINE) 5000 UNIT/ML IJ SOLN
5000.0000 [IU] | Freq: Three times a day (TID) | INTRAMUSCULAR | Status: DC
  Administered 2022-08-16: 5000 [IU] via SUBCUTANEOUS
  Filled 2022-08-16: qty 1

## 2022-08-16 MED ORDER — ONDANSETRON HCL 4 MG PO TABS: 4.0000 mg | ORAL_TABLET | Freq: Four times a day (QID) | ORAL | Status: DC | PRN

## 2022-08-16 MED ORDER — ONDANSETRON HCL 4 MG/2ML IJ SOLN
4.0000 mg | Freq: Four times a day (QID) | INTRAMUSCULAR | Status: DC | PRN
  Administered 2022-08-16 – 2022-08-17 (×2): 4 mg via INTRAVENOUS
  Filled 2022-08-16 (×2): qty 2

## 2022-08-16 MED ORDER — ORAL CARE MOUTH RINSE: 15.0000 mL | OROMUCOSAL | Status: DC | PRN

## 2022-08-16 MED ORDER — LABETALOL HCL 5 MG/ML IV SOLN: 10.0000 mg | INTRAVENOUS | Status: DC | PRN
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
## 2022-08-16 NOTE — ED Notes (Signed)
Patient notified that he has been assigned to semi-private (shared) room. Patient verbalized understanding.
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
## 2022-08-16 NOTE — Assessment & Plan Note (Signed)
Hold HCTZ for now due to AKI.

## 2022-08-16 NOTE — Assessment & Plan Note (Signed)
Patient smokes about a pack every 3 days.  Will order nicotine patch.

## 2022-08-16 NOTE — Assessment & Plan Note (Signed)
Observation medical bed.  Continue with IV normal saline.  His AKI is likely due to dehydration, vomiting.  His uric acid level is within normal limits.  I think his leukocytosis is more reactive.  Unlikely to be leukemia/lymphoma.  Repeat CK in the morning.
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
## 2022-08-16 NOTE — Assessment & Plan Note (Signed)
Continue with IVF with NS. No hyperpeaked T-waves on EKG.
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
## 2022-08-17 DIAGNOSIS — N179 Acute kidney failure, unspecified: Secondary | ICD-10-CM

## 2022-08-17 LAB — BASIC METABOLIC PANEL
Anion gap: 7 (ref 5–15)
BUN: 26 mg/dL — ABNORMAL HIGH (ref 6–20)
CO2: 25 mmol/L (ref 22–32)
Calcium: 8.7 mg/dL — ABNORMAL LOW (ref 8.9–10.3)
Chloride: 104 mmol/L (ref 98–111)
Creatinine, Ser: 1.17 mg/dL (ref 0.61–1.24)
GFR, Estimated: >60 mL/min (ref >60)
Glucose, Bld: 87 mg/dL (ref 70–99)
Potassium: 4.6 mmol/L (ref 3.5–5.1)
Sodium: 136 mmol/L (ref 135–145)

## 2022-08-17 LAB — CK: Total CK: 1152 U/L — ABNORMAL HIGH (ref 49–397)
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
## 2022-08-17 NOTE — Plan of Care (Signed)

## 2022-08-18 DIAGNOSIS — N179 Acute kidney failure, unspecified: Secondary | ICD-10-CM | POA: Diagnosis not present

## 2022-08-18 LAB — CBC WITH DIFFERENTIAL/PLATELET
Abs Immature Granulocytes: 0.02 10*3/uL (ref 0.00–0.07)
Basophils Absolute: 0 10*3/uL (ref 0.0–0.1)
Basophils Relative: 1 %
Eosinophils Absolute: 0.2 10*3/uL (ref 0.0–0.5)
Eosinophils Relative: 3 %
HCT: 38 % — ABNORMAL LOW (ref 39.0–52.0)
Hemoglobin: 12.7 g/dL — ABNORMAL LOW (ref 13.0–17.0)
Immature Granulocytes: 0 %
Lymphocytes Relative: 41 %
Lymphs Abs: 2.7 10*3/uL (ref 0.7–4.0)
MCH: 30.6 pg (ref 26.0–34.0)
MCHC: 33.4 g/dL (ref 30.0–36.0)
MCV: 91.6 fL (ref 80.0–100.0)
Monocytes Absolute: 0.8 10*3/uL (ref 0.1–1.0)
Monocytes Relative: 12 %
Neutro Abs: 2.9 10*3/uL (ref 1.7–7.7)
Neutrophils Relative %: 43 %
Platelets: 256 10*3/uL (ref 150–400)
RBC: 4.15 MIL/uL — ABNORMAL LOW (ref 4.22–5.81)
RDW: 12.8 % (ref 11.5–15.5)
WBC: 6.5 10*3/uL (ref 4.0–10.5)
nRBC: 0 % (ref 0.0–0.2)

## 2022-08-18 LAB — BASIC METABOLIC PANEL
Anion gap: 7 (ref 5–15)
BUN: 23 mg/dL — ABNORMAL HIGH (ref 6–20)
CO2: 24 mmol/L (ref 22–32)
Calcium: 8.8 mg/dL — ABNORMAL LOW (ref 8.9–10.3)
Chloride: 105 mmol/L (ref 98–111)
Creatinine, Ser: 0.96 mg/dL (ref 0.61–1.24)
GFR, Estimated: >60 mL/min (ref >60)
Glucose, Bld: 95 mg/dL (ref 70–99)
Potassium: 4 mmol/L (ref 3.5–5.1)
Sodium: 136 mmol/L (ref 135–145)

## 2022-08-18 LAB — CK: Total CK: 515 U/L — ABNORMAL HIGH (ref 49–397)

## 2022-08-18 MED ORDER — OXYCODONE-ACETAMINOPHEN 5-325 MG PO TABS: 1.0000 | ORAL_TABLET | Freq: Three times a day (TID) | ORAL | 0 refills | Status: DC | PRN

## 2022-08-18 MED ORDER — OXYCODONE-ACETAMINOPHEN 10-325 MG PO TABS: 1.0000 | ORAL_TABLET | Freq: Three times a day (TID) | ORAL | 0 refills | Status: DC | PRN

## 2022-08-18 MED ORDER — AMLODIPINE BESYLATE 5 MG PO TABS: 5.0000 mg | ORAL_TABLET | Freq: Every day | ORAL | 11 refills | Status: DC
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
## 2022-08-18 NOTE — TOC CM/SW Note (Signed)
Patient has orders to discharge home today. Chart reviewed. No TOC needs identified. CSW signing off.  Shalie Schremp, CSW [phone]  

## 2022-08-19 ENCOUNTER — Encounter: Payer: Self-pay | Admitting: Nurse Practitioner

## 2022-08-22 ENCOUNTER — Other Ambulatory Visit: Payer: Self-pay | Admitting: Nurse Practitioner

## 2022-08-22 DIAGNOSIS — Z79891 Long term (current) use of opiate analgesic: Secondary | ICD-10-CM

## 2022-08-22 DIAGNOSIS — R52 Pain, unspecified: Secondary | ICD-10-CM

## 2022-08-23 ENCOUNTER — Telehealth: Payer: Self-pay

## 2022-08-23 NOTE — Transitions of Care (Post Inpatient/ED Visit) (Signed)
08/23/2022  Name: Nicholas Lopez MRN: [id number] DOB: [birth year]  Today's TOC FU Call Status: Today's TOC FU Call Status:: Successful TOC FU Call Competed TOC FU Call Complete Date: 08/23/22  Transition Care Management Follow-up Telephone Call Date of Discharge: 08/18/22 Discharge Facility: [facility]) Type of Discharge: Inpatient Admission Primary Inpatient Discharge Diagnosis:: AKI How have you been since you were released from the hospital?: Better Any questions or concerns?: Yes Patient Questions/Concerns:: he wanted to know if Ms Meredeth Ide, NP made the referral to the spine and pain specialist he requested.  I informed him that the referral was just placed.  He said he has the phone number for the clinic and can call and check on the status. .  he currently goes to [facility] pain management and has an appointment there tomorrow, 08/24/2022.  He said that Dr Karalee Height, his pain management provider prescribed suboxone and he belives that is what caused his hospitalization. he said he still has the suboxiine but plans to take it back to Dr Elonda Husky tomorow. Patient Questions/Concerns Addressed: Other: (He will speak to Dr Elonda Husky tomorrow about the suboone and percocet.)  Items Reviewed: Did you receive and understand the discharge instructions provided?: Yes Medications obtained,verified, and reconciled?: Yes (Medications Reviewed) Any new allergies since your discharge?: No Dietary orders reviewed?: No Do you have support at home?: Yes People in Home: parent(s) Name of Support/Comfort Primary Source: his mother.  His 73 yo son also stays with him  Medications Reviewed Today:  medication list reviewed with patient.  This list is not the same as his AVS.  Allopurinol and hydrodiuril have been discontinued per AVS.  He has not picked up the methocarbamol and he is not taking the suboxone.  Medications Reviewed Today     Reviewed by Louanna Raw CPhT (Pharmacy  Technician) on 08/15/22 at 2236  Med List Status: Complete   Medication Order Taking? Sig Documenting Provider Last Dose Status Informant  allopurinol (ZYLOPRIM) 100 MG tablet [id number] Yes Take 2 tablets by mouth daily. [provider] Past Week Active   Blood Pressure Monitoring (BLOOD PRESSURE MONITOR/M CUFF) MISC [id number]  1 Device by Does not apply route daily. Claiborne Rigg, NP  Active   gabapentin (NEURONTIN) 300 MG capsule [id number] Yes Take 1 capsule (300 mg total) by mouth 3 (three) times daily as needed. Claiborne Rigg, NP unk unk Active   hydrochlorothiazide (HYDRODIURIL) 25 MG tablet [id number] Yes Take 1 tablet (25 mg total) by mouth daily. Claiborne Rigg, NP Past Week unk Active   hydrOXYzine (ATARAX) 25 MG tablet [id number] Yes Take 25 mg by mouth every 6 (six) hours as needed. [provider] unk unk Active   methocarbamol (ROBAXIN) 500 MG tablet [id number] Yes Take 1 tablet (500 mg total) by mouth every 6 (six) hours as needed for muscle spasms. Claiborne Rigg, NP unk unk Active   QUEtiapine (SEROQUEL) 50 MG tablet [id number] Yes Take 50 mg by mouth at bedtime. [provider] Past Week Active   SUBOXONE 8-2 MG FILM [id number] Yes Place 1 Film under the tongue as directed. [provider] Past Week Active             Home Care and Equipment/Supplies: Were Home Health Services Ordered?: No Any new equipment or medical supplies ordered?: No  Functional Questionnaire: Do you need assistance with bathing/showering or dressing?: No Do you need assistance with meal preparation?: No Do you need assistance with eating?: No Do  you have difficulty maintaining continence: No Do you need assistance with getting out of bed/getting out of a chair/moving?: No Do you have difficulty managing or taking your medications?: No  Follow up appointments reviewed: PCP Follow-up appointment confirmed?: NA Specialist Hospital Follow-up appointment  confirmed?: Yes Date of Specialist follow-up appointment?: 08/12/22 Follow-Up Specialty Provider:: cardiology. Do you need transportation to your follow-up appointment?: No (He uses Medicaid transportation) Do you understand care options if your condition(s) worsen?: Yes-patient verbalized understanding    SIGNATURE  Robyne Peers, RN

## 2022-09-08 ENCOUNTER — Other Ambulatory Visit: Payer: Self-pay | Admitting: Nurse Practitioner

## 2022-09-08 DIAGNOSIS — Z87828 Personal history of other (healed) physical injury and trauma: Secondary | ICD-10-CM

## 2022-09-12 ENCOUNTER — Ambulatory Visit: Payer: Medicaid Other | Attending: Cardiology | Admitting: Cardiology

## 2022-09-12 ENCOUNTER — Encounter: Payer: Self-pay | Admitting: Cardiology

## 2022-09-12 ENCOUNTER — Other Ambulatory Visit: Payer: Self-pay | Admitting: Nurse Practitioner

## 2022-09-12 VITALS — BP 156/102 | HR 91 | Ht 67.0 in | Wt 176.6 lb

## 2022-09-12 DIAGNOSIS — I719 Aortic aneurysm of unspecified site, without rupture: Secondary | ICD-10-CM | POA: Diagnosis not present

## 2022-09-12 DIAGNOSIS — I10 Essential (primary) hypertension: Secondary | ICD-10-CM | POA: Diagnosis not present

## 2022-09-12 DIAGNOSIS — F17200 Nicotine dependence, unspecified, uncomplicated: Secondary | ICD-10-CM

## 2022-09-12 DIAGNOSIS — R072 Precordial pain: Secondary | ICD-10-CM

## 2022-09-12 MED ORDER — AMLODIPINE BESYLATE 5 MG PO TABS: 5.0000 mg | ORAL_TABLET | Freq: Two times a day (BID) | ORAL | 0 refills | Status: DC

## 2022-09-12 MED ORDER — HYDROXYZINE HCL 25 MG PO TABS: 25.0000 mg | ORAL_TABLET | Freq: Four times a day (QID) | ORAL | 1 refills | Status: DC | PRN

## 2022-09-12 MED ORDER — NICOTINE 21 MG/24HR TD PT24: 21.0000 mg | MEDICATED_PATCH | Freq: Every day | TRANSDERMAL | 0 refills | Status: AC

## 2022-09-12 MED ORDER — NICOTINE 14 MG/24HR TD PT24: 14.0000 mg | MEDICATED_PATCH | Freq: Every day | TRANSDERMAL | 0 refills | Status: AC

## 2022-09-12 MED ORDER — NICOTINE 7 MG/24HR TD PT24: 7.0000 mg | MEDICATED_PATCH | Freq: Every day | TRANSDERMAL | 0 refills | Status: AC
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
## 2022-09-12 NOTE — Patient Instructions (Addendum)
Medication Instructions:   INCREASE Amlodipine - Take one tablet ( 5mg ) by mouth twice a day.   2. START Nicotine Patch  - place one patch (21mg ) onto skin daily for 42 days (today through 07/27) - place one patch (14mg ) onto skin daily for 14 days (07/28 through 08/10) - Place one patch (7mg ) onto skin daily for 14 days (08/11 through 08/24)  - THEN STOP    *If you need a refill on your cardiac medications before your next appointment, please call your pharmacy*   Lab Work:  1, None Ordered  If you have labs (blood work) drawn today and your tests are completely normal, you will receive your results only by: MyChart Message (if you have MyChart) OR A paper copy in the mail If you have any lab test that is abnormal or we need to change your treatment, we will call you to review the results.   Testing/Procedures:  Your physician has requested that you have an echocardiogram. Echocardiography is a painless test that uses sound waves to create images of your heart. It provides your doctor with information about the size and shape of your heart and how well your heart's chambers and valves are working. This procedure takes approximately one hour. There are no restrictions for this procedure. Please do NOT wear cologne, perfume, aftershave, or lotions (deodorant is allowed). Please arrive 15 minutes prior to your appointment time.  2. Non-Cardiac CT Angiography (CTA), is a special type of CT scan that uses a computer to produce multi-dimensional views of major blood vessels throughout the body. In CT angiography, a contrast material is injected through an IV to help visualize the blood vessels   Follow-Up: At [facility], you and your health needs are our priority.  As part of our continuing mission to provide you with exceptional heart care, we have created designated Provider Care Teams.  These Care Teams include your primary Cardiologist (physician) and Advanced Practice  Providers (APPs -  Physician Assistants and Nurse Practitioners) who all work together to provide you with the care you need, when you need it.  We recommend signing up for the patient portal called "MyChart".  Sign up information is provided on this After Visit Summary.  MyChart is used to connect with patients for Virtual Visits (Telemedicine).  Patients are able to view lab/test results, encounter notes, upcoming appointments, etc.  Non-urgent messages can be sent to your provider as well.   To learn more about what you can do with MyChart, go to [email or web address].    Your next appointment:    After echocardiogram  Provider:   You may see Debbe Odea, MD or one of the following Advanced Practice Providers on your designated Care Team:   Nicolasa Ducking, NP Eula Listen, PA-C Cadence Fransico Michael, PA-C Charlsie Quest, NP

## 2022-09-14 NOTE — Addendum Note (Signed)
Addended by: Guerry Minors on: 09/14/2022 10:59 AM   Modules accepted: Orders

## 2022-09-28 ENCOUNTER — Ambulatory Visit: Payer: Medicaid Other | Attending: Cardiology

## 2022-09-28 ENCOUNTER — Ambulatory Visit: Payer: Medicaid Other | Attending: Physician Assistant | Admitting: Physical Therapy

## 2022-09-28 DIAGNOSIS — M25512 Pain in left shoulder: Secondary | ICD-10-CM | POA: Insufficient documentation

## 2022-09-28 DIAGNOSIS — M6281 Muscle weakness (generalized): Secondary | ICD-10-CM | POA: Insufficient documentation

## 2022-09-28 DIAGNOSIS — M25312 Other instability, left shoulder: Secondary | ICD-10-CM | POA: Insufficient documentation
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
## 2022-10-14 ENCOUNTER — Ambulatory Visit: Payer: Medicaid Other | Admitting: Cardiology

## 2022-10-17 ENCOUNTER — Other Ambulatory Visit: Payer: Self-pay | Admitting: Cardiology

## 2022-10-17 DIAGNOSIS — F17200 Nicotine dependence, unspecified, uncomplicated: Secondary | ICD-10-CM

## 2022-10-17 DIAGNOSIS — I719 Aortic aneurysm of unspecified site, without rupture: Secondary | ICD-10-CM

## 2022-10-17 DIAGNOSIS — R072 Precordial pain: Secondary | ICD-10-CM

## 2022-10-17 DIAGNOSIS — I10 Essential (primary) hypertension: Secondary | ICD-10-CM

## 2022-10-21 ENCOUNTER — Encounter: Payer: Self-pay | Admitting: Nurse Practitioner

## 2022-10-24 ENCOUNTER — Ambulatory Visit: Payer: Medicaid Other

## 2022-10-24 ENCOUNTER — Other Ambulatory Visit: Payer: Self-pay | Admitting: Nurse Practitioner

## 2022-10-24 DIAGNOSIS — F4310 Post-traumatic stress disorder, unspecified: Secondary | ICD-10-CM

## 2022-10-24 DIAGNOSIS — M25512 Pain in left shoulder: Secondary | ICD-10-CM | POA: Diagnosis present

## 2022-10-24 DIAGNOSIS — M25312 Other instability, left shoulder: Secondary | ICD-10-CM

## 2022-10-24 DIAGNOSIS — M6281 Muscle weakness (generalized): Secondary | ICD-10-CM
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
## 2022-10-27 ENCOUNTER — Ambulatory Visit: Payer: Medicaid Other | Attending: Cardiology

## 2022-10-28 ENCOUNTER — Ambulatory Visit: Admission: RE | Admit: 2022-10-28 | Payer: Medicaid Other | Source: Ambulatory Visit

## 2022-11-01 ENCOUNTER — Ambulatory Visit: Payer: Medicaid Other | Admitting: Physical Therapy

## 2022-11-01 ENCOUNTER — Ambulatory Visit: Payer: Medicaid Other | Admitting: Cardiology
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
## 2022-11-03 ENCOUNTER — Telehealth: Payer: Self-pay | Admitting: Physical Therapy

## 2022-11-03 ENCOUNTER — Encounter: Payer: Medicaid Other | Admitting: Physical Therapy

## 2022-11-03 ENCOUNTER — Ambulatory Visit: Payer: Medicaid Other | Attending: Physician Assistant | Admitting: Physical Therapy

## 2022-11-03 NOTE — Telephone Encounter (Signed)
Left VM for patient notifying of missed PT appointment scheduled for today at 4:45pm. Reviewed cancellation policy with regard to no-show appointments and requested he call back to reschedule or confirm his next appointment.   Luretha Murphy. Ilsa Iha, PT, DPT 11/03/22, 5:13 PM  [facility] Rehab [address] [place], [place] [address] P: [phone] I F: [phone]

## 2022-11-07 ENCOUNTER — Ambulatory Visit: Payer: Medicaid Other | Admitting: Physical Therapy

## 2022-11-07 ENCOUNTER — Telehealth: Payer: Self-pay | Admitting: Physical Therapy
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
## 2022-11-07 NOTE — Telephone Encounter (Signed)
Left VM notifying patient of no-show PT appointment scheduled today at 10:30am. Informed him we are taking his remaining appointments off the schedule per our cancellation policy that states we remove them after two no-shows.   Luretha Murphy. Ilsa Iha, PT, DPT 11/07/22, 10:58 AM  [facility] [address] [place], [place] [address] P: [phone] I F: [phone]

## 2022-11-10 ENCOUNTER — Encounter: Payer: Medicaid Other | Admitting: Physical Therapy

## 2022-11-14 ENCOUNTER — Encounter: Payer: Medicaid Other | Admitting: Physical Therapy

## 2022-11-16 ENCOUNTER — Encounter: Payer: Medicaid Other | Admitting: Physical Therapy

## 2022-11-22 ENCOUNTER — Encounter: Payer: Medicaid Other | Admitting: Physical Therapy

## 2022-11-24 ENCOUNTER — Encounter: Payer: Medicaid Other | Admitting: Physical Therapy

## 2022-11-29 ENCOUNTER — Encounter: Payer: Medicaid Other | Admitting: Physical Therapy

## 2022-12-01 ENCOUNTER — Encounter: Payer: Medicaid Other | Admitting: Physical Therapy

## 2022-12-12 ENCOUNTER — Ambulatory Visit (INDEPENDENT_AMBULATORY_CARE_PROVIDER_SITE_OTHER): Payer: Medicaid Other | Admitting: Psychiatry

## 2022-12-12 ENCOUNTER — Encounter: Payer: Self-pay | Admitting: Psychiatry

## 2022-12-12 ENCOUNTER — Other Ambulatory Visit
Admission: RE | Admit: 2022-12-12 | Discharge: 2022-12-12 | Disposition: A | Discharge status: Home / Self Care | Payer: Medicaid Other | Source: Ambulatory Visit | Attending: Psychiatry | Admitting: Psychiatry

## 2022-12-12 ENCOUNTER — Other Ambulatory Visit: Payer: Self-pay | Admitting: Cardiology

## 2022-12-12 VITALS — BP 142/91 | HR 88 | Temp 98.1°F | Ht 67.0 in | Wt 181.8 lb

## 2022-12-12 DIAGNOSIS — F419 Anxiety disorder, unspecified: Secondary | ICD-10-CM

## 2022-12-12 DIAGNOSIS — F1290 Cannabis use, unspecified, uncomplicated: Secondary | ICD-10-CM | POA: Diagnosis present

## 2022-12-12 DIAGNOSIS — F4310 Post-traumatic stress disorder, unspecified: Secondary | ICD-10-CM

## 2022-12-12 DIAGNOSIS — Z79899 Other long term (current) drug therapy: Secondary | ICD-10-CM

## 2022-12-12 DIAGNOSIS — Z789 Other specified health status: Secondary | ICD-10-CM | POA: Diagnosis present

## 2022-12-12 LAB — TSH: TSH: 1.438 u[IU]/mL (ref 0.350–4.500)

## 2022-12-12 MED ORDER — SERTRALINE HCL 25 MG PO TABS
25.0000 mg | ORAL_TABLET | Freq: Every day | ORAL | 1 refills | Status: DC
Start: 2022-12-12 — End: 2023-10-13
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
## 2022-12-12 NOTE — Patient Instructions (Addendum)
[email or web address]  [email or web address]  [email or web address] - for EMDR for your trauma. [facility] / Ardeth Perfect. [facility] [address], [place] [phone]  [email or web address] [facility] [address], [place], [place] [address]   [phone]  [facility] [email or web address] [address] [place], [place] [address]  [phone]  [facility], [facility] [email or web address] [address] [place], [place] [address]  [phone]   [facility] - [phone]  [facility] - [phone]  [facility] - [phone] counseling [phone]  [facility] [phone]   [email or web address] this clinician can offer telehealth and has a sliding scale option  [email or web address] this group also offers sliding scale rates and is based out of [place]  Dr. Liborio Nixon with the [facility] specializes in divorce  [employer] [facility] has interns who offer sliding scale rates and some of the full time clinicians do, as well. You complete their contact form on their website and the referrals coordinator will help to get connected to someone   Medicaid below :  [facility] [address] [place], [place] [address]  [phone]    [employer] [address] [place], [place] [address]  [phone]    [facility] [address] [place], [place] [address]  [phone]     Sertraline Tablets What is this medication? SERTRALINE (SER tra leen) treats depression, anxiety, obsessive-compulsive disorder (OCD), post-traumatic stress disorder (PTSD), and premenstrual dysphoric disorder (PMDD). It increases the amount of  serotonin in the brain, a hormone that helps regulate mood. It belongs to a group of medications called SSRIs. This medicine may be used for other purposes; ask your health care provider or pharmacist if you have questions. COMMON BRAND NAME(S): Zoloft What should I tell my care team before I take this medication? They need to know if you have any of these conditions: Bleeding disorders Bipolar disorder or a family history of bipolar disorder Frequently drink alcohol Glaucoma Heart disease High blood pressure History of irregular heartbeat History of low levels of calcium, magnesium, or potassium in the blood Liver disease Receiving electroconvulsive therapy Seizures Suicidal thoughts, plans, or attempt by you or a family member Take medications that prevent or treat blood clots Thyroid disease An unusual or allergic reaction to sertraline, other medications, foods, dyes, or preservatives Pregnant or trying to get pregnant Breastfeeding How should I use this medication? Take this medication by mouth with a glass of water. Take it as directed on the prescription label at the same time every day. You can take it with or without food. If it upsets your stomach, take it with food. Do not take your medication more often than directed. Keep taking this medication unless your care team tells you to stop. Stopping it too quickly can cause serious side effects. It can also make your condition worse. A special MedGuide will be given to you by the pharmacist with each prescription and refill. Be sure to read this information carefully each time. Talk to your care team about the use of this medication in children. While it may be prescribed for children as young as 7 years for selected conditions, precautions do apply. Overdosage: If you think you have taken too much of this medicine contact a poison control center or emergency room at once. NOTE:  This medicine is only for you. Do not share this  medicine with others. What if I miss a dose? If you miss a dose, take it as soon as you can. If it is almost time for your next dose, take only that dose. Do not take double or extra doses. What may interact with this medication? Do not take this medication with any of the following: Cisapride Dronedarone Linezolid MAOIs, such as Carbex, Eldepryl, Marplan, Nardil, and Parnate Methylene blue (injected into a vein) Pimozide Thioridazine This medication may also interact with the following: Alcohol Amphetamines Aspirin and aspirin-like medications Certain medications for fungal infections, such as ketoconazole, fluconazole, posaconazole, itraconazole Certain medications for irregular heart beat, such as flecainide, quinidine, propafenone Certain medications for mental health conditions Certain medications for migraine headaches, such as almotriptan, eletriptan, frovatriptan, naratriptan, rizatriptan, sumatriptan, zolmitriptan Certain medications for seizures, such as carbamazepine, valproic acid, phenytoin Certain medications for sleep Certain medications that prevent or treat blood clots, such as warfarin, enoxaparin, dalteparin Cimetidine Digoxin Diuretics Fentanyl Isoniazid Lithium NSAIDs, medications for pain and inflammation, such as ibuprofen or naproxen Other medications that cause heart rhythm changes, such as dofetilide Rasagiline Safinamide Supplements, such as St. John's wort, kava kava, valerian Tolbutamide Tramadol Tryptophan This list may not describe all possible interactions. Give your health care provider a list of all the medicines, herbs, non-prescription drugs, or dietary supplements you use. Also tell them if you smoke, drink alcohol, or use illegal drugs. Some items may interact with your medicine. What should I watch for while using this medication? Tell your care team if your symptoms do not get better or if they get worse. Visit your care team for regular  checks on your progress. Because it may take several weeks to see the full effects of this medication, it is important to continue your treatment as prescribed by your care team. Patients and their families should watch out for new or worsening thoughts of suicide or depression. Also watch out for sudden changes in feelings such as feeling anxious, agitated, panicky, irritable, hostile, aggressive, impulsive, severely restless, overly excited and hyperactive, or not being able to sleep. If this happens, especially at the beginning of treatment or after a change in dose, call your care team. This medication may affect your coordination, reaction time, or judgment. Do not drive or operate machinery until you know how this medication affects you. Sit or stand up slowly to reduce the risk of dizzy or fainting spells. Drinking alcohol with this medication can increase the risk of these side effects. Your mouth may get dry. Chewing sugarless gum or sucking hard candy, and drinking plenty of water may help. Contact your care team if the problem does not go away or is severe. What side effects may I notice from receiving this medication? Side effects that you should report to your care team as soon as possible: Allergic reactions--skin rash, itching, hives, swelling of the face, lips, tongue, or throat Bleeding--bloody or black, tar-like stools, red or dark brown urine, vomiting blood or brown material that looks like coffee grounds, small red or purple spots on skin, unusual bleeding or bruising Heart rhythm changes--fast or irregular heartbeat, dizziness, feeling faint or lightheaded, chest pain, trouble breathing Low sodium level--muscle weakness, fatigue, dizziness, headache, confusion Serotonin syndrome--irritability, confusion, fast or irregular heartbeat, muscle stiffness, twitching muscles, sweating, high fever, seizure, chills, vomiting, diarrhea Sudden eye pain or change in vision such as blurred  vision, seeing halos around lights, vision loss Thoughts of suicide  or self-harm, worsening mood Side effects that usually do not require medical attention (report these to your care team if they continue or are bothersome): Change in sex drive or performance Diarrhea Excessive sweating Nausea Tremors or shaking Upset stomach This list may not describe all possible side effects. Call your doctor for medical advice about side effects. You may report side effects to FDA at 1-800-FDA-1088. Where should I keep my medication? Keep out of the reach of children and pets. Store at room temperature between 20 and 25 degrees C (68 and 77 degrees F). Get rid of any unused medication after the expiration date. To get rid of medications that are no longer needed or expired: Take the medication to a medication take-back program. Check with your pharmacy or law enforcement to find a location. If you cannot return the medication, check the label or package insert to see if the medication should be thrown out in the garbage or flushed down the toilet. If you are not sure, ask your care team. If it is safe to put in the trash, empty the medication out of the container. Mix the medication with cat litter, dirt, coffee grounds, or other unwanted substance. Seal the mixture in a bag or container. Put it in the trash. NOTE: This sheet is a summary. It may not cover all possible information. If you have questions about this medicine, talk to your doctor, pharmacist, or health care provider.  2024 Elsevier/Gold Standard (2021-10-12 00:00:00)

## 2022-12-13 ENCOUNTER — Other Ambulatory Visit: Payer: Self-pay

## 2022-12-13 MED ORDER — AMLODIPINE BESYLATE 5 MG PO TABS: 5.0000 mg | ORAL_TABLET | Freq: Two times a day (BID) | ORAL | 0 refills | Status: DC

## 2022-12-13 NOTE — Telephone Encounter (Signed)
Requested Prescriptions   Signed Prescriptions Disp Refills   amLODipine (NORVASC) 5 MG tablet 180 tablet 0    Sig: Take 1 tablet (5 mg total) by mouth 2 (two) times daily.    Authorizing Provider: Debbe Odea    Ordering User: Guerry Minors

## 2022-12-13 NOTE — Telephone Encounter (Signed)
No confirmation received with Rx sent on 12/13/22 at 8:00.  Recent rx at 10:09 with receipt confirmed from pharmacy received.

## 2022-12-16 ENCOUNTER — Telehealth: Payer: Self-pay | Admitting: Psychiatry

## 2022-12-16 ENCOUNTER — Ambulatory Visit: Payer: Medicaid Other | Admitting: Cardiology

## 2022-12-16 LAB — URINE DRUGS OF ABUSE SCREEN W ALC, ROUTINE (REF LAB)
Amphetamines, Urine: NEGATIVE ng/mL
Barbiturate, Ur: NEGATIVE ng/mL
Benzodiazepine Quant, Ur: NEGATIVE ng/mL
Ethanol U, Quan: NEGATIVE %
Methadone Screen, Urine: NEGATIVE ng/mL
Opiate Quant, Ur: NEGATIVE ng/mL
Phencyclidine, Ur: NEGATIVE ng/mL
Propoxyphene, Urine: NEGATIVE ng/mL

## 2022-12-16 LAB — COCAINE CONF, UR
Benzoylecgonine GC/MS Conf: 390 ng/mL
Cocaine Metab Quant, Ur: POSITIVE — AB

## 2022-12-16 LAB — PANEL 799049
CARBOXY THC GC/MS CONF: >750 ng/mL
Cannabinoid GC/MS, Ur: POSITIVE — AB

## 2022-12-16 NOTE — Telephone Encounter (Signed)
Patients Urine screen positive for cannabis and cocaine. Patient will benefit from substance abuse treatment. Will have staff contact patient .

## 2022-12-19 NOTE — Telephone Encounter (Signed)
Called patient as instructed by provider informed patient of the message from provider patient voiced understanding and stated that he would check with his PCP about it

## 2022-12-20 NOTE — Telephone Encounter (Signed)
Noted  

## 2022-12-27 ENCOUNTER — Ambulatory Visit: Payer: Medicaid Other | Attending: Cardiology

## 2023-01-13 ENCOUNTER — Ambulatory Visit: Payer: Medicaid Other | Admitting: Psychiatry

## 2023-01-27 ENCOUNTER — Telehealth: Payer: Self-pay | Admitting: Cardiology

## 2023-01-27 NOTE — Telephone Encounter (Signed)
Left voicemail to reschedule the appt on 11/4 due to echo not being completed. Echo needs to be reschedule, previously no show.

## 2023-01-30 ENCOUNTER — Ambulatory Visit: Payer: Medicaid Other | Attending: Cardiology | Admitting: Cardiology

## 2023-02-22 ENCOUNTER — Encounter: Payer: Self-pay | Admitting: Cardiology

## 2023-02-22 NOTE — Progress Notes (Signed)
Unable to contact to schedule echocardiogram, letter sent, order cancelled

## 2023-05-03 NOTE — H&P (Signed)
 [facility] Medicine  History and Physical    Assessment and Plan <redacted file path>   Nicholas Lopez is a 33 y.o. male who is presenting to [facility] with Non-traumatic rhabdomyolysis, in the setting of the following pertinent/contributing co-morbidities: HTN.   1. Non-traumatic rhabdomyolysis <redacted file path>: Illness that poses a threat to life or bodily function without appropriate treatment. Has a history of this happening in the past, last time in May 2024, in the setting of physical labor. Worked strenuous days yesterday and the day before, with temps nearly in the 80s.  - Continue IVF tonight, trend labs and CK - Tylenol  PRN pain - Tele x24 hours as patient is at risk for electrolyte derangements - Zofran  PRN nausea  Secondary/Additional Active Problems <redacted file path>: 2. HTN: Has not been on meds for some time, previously has been on amlodipine  and hydrochlorothiazide . Currently normotensive, but if BP rises during admission can restart (would favor amlodipine  over hydrochlorothiazide  given dehydration)  3. PTSD:  - Seroquel  25mg  PRN sleep  Prophylaxis Ambulation  Diet Regular diet  Code Status / HCDM <redacted file path> Full Code  HPI    Nicholas Lopez is a 33 y.o. male who is presenting to [facility] with Non-traumatic rhabdomyolysis. 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 He reports that he has not worked for a few weeks because of the cold temperatures, so when he went back to work on Monday he could feel how deconditioned he had become. On tuesday he worked another full day with temps in the high 70s, and he could feel himself becocming dehydrated and developing muscle cramps. At the ned of the day he had an episode of emesis, and he knew that he was getting rhabdo again. He has had two previous episodes of rhabdomyolysis in the setting of physically demanding work. The last time this  happened was in May of 2024, and he was admitted to [facility] with similar labs and symptoms. He recovered with IVF and symptomatic treatment. When seen for admission patient reports feeling poorly, though somewhat better than when he first arrived.   Med Rec Confidence <redacted file path>  I reviewed the Medication List. The current list is Accurate  Physical Exam  Temp:  [36.9 C (98.5 F)] 36.9 C (98.5 F) Heart Rate:  [91-131] 91 SpO2 Pulse:  [90-91] 90 Resp:  [18-20] 20 BP: (139-142)/(78-99) 139/78 SpO2:  [96 %-98 %] 96 % Body mass index is 28.19 kg/m.  General: Well appearing, alert, conversant, cooperative, and in no distress. Cardiovascular: Normal rate, regular rhythm. No murmur.  Extremities: Warm to touch. Regular 2+ radial pulses bilaterally. No LE edema. Respiratory: Normal respiratory effort on room air. Clear to auscultation bilaterally. Gastrointestinal: Soft, non-tender, non-distended with normoactive bowel sounds. Neurologic: Alert and fully oriented. Normal speech and language. Moving all extremities purposefully. No focal deficits. Skin: Skin is warm, dry and intact. No rash. Psychiatric: Mood and affect are normal. Speech and behavior are normal. ___________________________________________________________  Medications <redacted file path>   Prior to Admission medications   Medication Dose, Route, Frequency  amlodipine  (NORVASC ) 5 MG tablet 5 mg, Daily (standard)  gabapentin  (NEURONTIN ) 300 MG capsule 300 mg, 3 times a day (standard)  oxyCODONE -acetaminophen  (PERCOCET) 10-325 mg per tablet 1 tablet, Every 8 hours PRN  QUEtiapine  (SEROQUEL ) 50 MG tablet 50 mg, Nightly    Allergies <redacted file path>  Patient has no known allergies.   Medical  History <redacted file path>  No past medical history on file.  Social History <redacted file path>  Tobacco use:   has no history on file for tobacco use. Alcohol use:   has no history on file for alcohol use. Drug  use:  has no history on file for drug use.  Family History <redacted file path>  No family history on file.  Surgical History <redacted file path>  No past surgical history on file.

## 2023-05-04 LAB — LAB REPORT - SCANNED

## 2023-05-04 NOTE — Discharge Summary (Signed)
 Physician Discharge Summary HBR 4 BT1 [facility] [address] [place] [address] Dept: [phone] Loc: [phone]   Identifying Information:  Nicholas Lopez January 09, 1991 [id number]  Primary Care Physician: Theotis Haze Ruth, FNP  Code Status: Full Code  Admit Date: 05/02/2023  Discharge Date: 05/04/2023   Discharge To: Home  Discharge Service: HBR - HBC: Hospitalist Service #2   Discharge Attending Physician: Ozell JINNY Hirschfeld, MD PhD  Discharge Diagnoses: Principal Problem:   Non-traumatic rhabdomyolysis (POA: Yes) Active Problems:   Essential hypertension (POA: Yes)   PTSD (post-traumatic stress disorder) (POA: Yes)   AKI (acute kidney injury) (CMS-HCC) (POA: Yes) Resolved Problems:   * No resolved hospital problems. *   Hospital Course:  32yoM w/ HTN, PTSD, tobacco use, admitted with AKI (apparently pre-renal: sCr 4.35 --> 0.82 this admission) and elevation in CK (846 --> 368). 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 While that may explain his presentation would also need to consider other possibilities such as stimulant use, both in follow-up and should be present again similarly. He did not report any such exposure, but there was remark made in 11/2022 telephone note about UDS positive for THC and cocaine , and consideration of referral for counseling or other therapy for possible SUD. Alcohol use has also been documented as high in the past, which might also explain presentation, although he did not report significant alcohol use during this hospitalization. With improvement in labs and resolution in symptoms (particularly nausea) he was discharged with return precautions having been given.    Procedures: None No admission procedures for hospital encounter. ______________________________________________________________________ Discharge Medications:   Your Medication List      STOP taking these medications    oxyCODONE -acetaminophen  10-325 mg per tablet Commonly known as: PERCOCET       CONTINUE taking these medications    amlodipine  5 MG tablet Commonly known as: NORVASC  Take 1 tablet (5 mg total) by mouth daily.   gabapentin  300 MG capsule Commonly known as: NEURONTIN  Take 1 capsule (300 mg total) by mouth Three (3) times a day.   QUEtiapine  50 MG tablet Commonly known as: SEROQUEL  Take 1 tablet (50 mg total) by mouth nightly.        Allergies: Patient has no known allergies. ______________________________________________________________________ Pending Test Results (if blank, then none):   Most Recent Labs: CBC - Results in Past 30 Days Result Component Current Result Ref Range Previous Result Ref Range  HCT 49.9 (H) (05/02/2023) 39.0 - 48.0 % Not in Time Range   HGB 17.0 (H) (05/02/2023) 12.9 - 16.5 g/dL Not in Time Range   MCH 31.1 (05/02/2023) 25.9 - 32.4 pg Not in Time Range   MCHC 34.1 (05/02/2023) 32.0 - 36.0 g/dL Not in Time Range   MCV 91.2 (05/02/2023) 77.6 - 95.7 fL Not in Time Range   MPV 7.7 (05/02/2023) 6.8 - 10.7 fL Not in Time Range   Platelet 360 (05/02/2023) 150 - 450 10*9/L Not in Time Range   RBC 5.47 (05/02/2023) 4.26 - 5.60 10*12/L Not in Time Range   WBC 18.6 (H) (05/02/2023) 3.6 - 11.2 10*9/L Not in Time Range    BMP - Results in Past 30 Days Result Component Current Result Ref Range Previous Result Ref Range  BUN 24 (H) (05/04/2023) 9 - 23 mg/dL 37 (H) (09/28/7972) 9 - 23 mg/dL  Chloride 893 (09/27/7972) 98 - 107 mmol/L 99 (05/03/2023) 98 - 107 mmol/L  CO2 24.4 (  05/04/2023) 20.0 - 31.0 mmol/L 20.3 (05/03/2023) 20.0 - 31.0 mmol/L  Creatinine 0.82 (05/04/2023) 0.73 - 1.18 mg/dL 8.04 (H) (09/28/7972) 9.26 - 1.18 mg/dL  Glucose 91 (09/27/7972) 70 - 179 mg/dL 89 (09/28/7972) 70 - 820 mg/dL  Potassium 3.5 (09/27/7972) 3.4 - 4.8 mmol/L 3.1 (L) (05/03/2023) 3.4 - 4.8 mmol/L  Sodium 144 (05/04/2023) 135 - 145 mmol/L 139 (05/03/2023) 135 - 145 mmol/L    Cardiac markers - Results in Past 30 Days Result Component Current Result Ref Range Previous Result Ref Range  Creatine Kinase, Total 368.0 (H) (05/04/2023) 46.0 - 171.0 U/L 788.0 (H) (05/03/2023) 46.0 - 171.0 U/L   LFT's - Results in Past 30 Days Result Component Current Result Ref Range Previous Result Ref Range  Albumin  3.3 (L) (05/04/2023) 3.4 - 5.0 g/dL 3.8 (09/28/7972) 3.4 - 5.0 g/dL  Alkaline Phosphatase 57 (05/04/2023) 46 - 116 U/L 67 (05/03/2023) 46 - 116 U/L  ALT 27 (05/04/2023) 10 - 49 U/L 36 (05/03/2023) 10 - 49 U/L  AST 26 (05/04/2023) <=34 U/L 32 (05/03/2023) <=34 U/L  Total Bilirubin 0.5 (05/04/2023) 0.3 - 1.2 mg/dL 1.2 (09/28/7972) 0.3 - 1.2 mg/dL   Toxicology screen -  No results found for requested labs within last 30 days.  [    Relevant Studies/Radiology (if blank, then none): No results found. ______________________________________________________________________ Discharge Instructions:     Follow Up instructions and Outpatient Referrals    Discharge instructions       Other Instructions     Discharge instructions     You were treated for an acute kidney injury (worsening kidney function) that is likely related to not eating and drinking well, with nausea and vomiting causing you to become dehydrated. Continue to eat and drink normally, taking in fluids. Avoid exposure to things (whether medications, non-prescribed substances such as stimulants, or over-exerting yourself) that might predispose you to (or lead to) injury to muscles, nausea, or other problems. Please do not hesitate to return to medical attention for any new or worsening symptoms, especially difficulty eating or drinking, nausea and vomiting, muscle pains, changes in the quantity or appearance of urine, or anything else of concern. Get in touch with your doctor as soon as you can - it may be helpful to be seen long-term and over time with regular visits.         ______________________________________________________________________ Discharge Day Services: BP 124/83   Pulse 75   Temp 36.2 C (97.2 F) (Temporal)   Resp 18   Ht 170.2 cm (5' 7)   Wt 82 kg (180 lb 11.2 oz)   SpO2 98%   BMI 28.30 kg/m  Pt seen on the day of discharge and determined appropriate for discharge. GEN: alert, NAD HEENT: mmm, no scleral icterus LYMPH: no LAD in head/neck CV: +s1/s2, nrrr, no m/r/g  PULM: ctab ABD: +bs, sntnd EXT: wwp, no edema MSK: no ttp along arms or shoulders NEURO: ma4e, no focal motor deficit PSYCH: answers questions appropriately  Condition at Discharge: good  Length of Discharge: I spent greater than 55 minutes in the care of the patient on the day of discharge, including history and exam, review of records, counseling of the patient, coordination of care.

## 2023-05-11 ENCOUNTER — Other Ambulatory Visit: Payer: Self-pay

## 2023-05-11 ENCOUNTER — Encounter: Payer: Self-pay | Admitting: Nurse Practitioner

## 2023-05-11 DIAGNOSIS — Z79891 Long term (current) use of opiate analgesic: Secondary | ICD-10-CM

## 2023-05-11 DIAGNOSIS — R52 Pain, unspecified: Secondary | ICD-10-CM

## 2023-05-11 NOTE — Addendum Note (Signed)
Addended by: Arbie Cookey on: 05/11/2023 03:22 PM   Modules accepted: Orders

## 2023-06-29 ENCOUNTER — Ambulatory Visit: Payer: Medicaid Other | Admitting: Dermatology

## 2023-07-11 ENCOUNTER — Ambulatory Visit: Admitting: Dermatology

## 2023-09-26 ENCOUNTER — Ambulatory Visit: Admitting: Dermatology

## 2023-10-03 ENCOUNTER — Ambulatory Visit: Admitting: Nurse Practitioner

## 2023-10-12 ENCOUNTER — Observation Stay (HOSPITAL_COMMUNITY)
Admission: EM | Admit: 2023-10-12 | Discharge: 2023-10-13 | Disposition: A | Discharge status: Home / Self Care | Attending: Family Medicine | Admitting: Family Medicine

## 2023-10-12 ENCOUNTER — Encounter (HOSPITAL_COMMUNITY): Payer: Self-pay

## 2023-10-12 ENCOUNTER — Other Ambulatory Visit: Payer: Self-pay

## 2023-10-12 DIAGNOSIS — F1090 Alcohol use, unspecified, uncomplicated: Secondary | ICD-10-CM | POA: Insufficient documentation

## 2023-10-12 DIAGNOSIS — F40240 Claustrophobia: Secondary | ICD-10-CM | POA: Diagnosis present

## 2023-10-12 DIAGNOSIS — D72829 Elevated white blood cell count, unspecified: Secondary | ICD-10-CM | POA: Diagnosis present

## 2023-10-12 DIAGNOSIS — F1910 Other psychoactive substance abuse, uncomplicated: Secondary | ICD-10-CM

## 2023-10-12 DIAGNOSIS — Z818 Family history of other mental and behavioral disorders: Secondary | ICD-10-CM

## 2023-10-12 DIAGNOSIS — F4310 Post-traumatic stress disorder, unspecified: Secondary | ICD-10-CM | POA: Diagnosis present

## 2023-10-12 DIAGNOSIS — R55 Syncope and collapse: Secondary | ICD-10-CM | POA: Diagnosis present

## 2023-10-12 DIAGNOSIS — E860 Dehydration: Principal | ICD-10-CM | POA: Diagnosis present

## 2023-10-12 DIAGNOSIS — F1410 Cocaine abuse, uncomplicated: Secondary | ICD-10-CM | POA: Diagnosis not present

## 2023-10-12 DIAGNOSIS — Z811 Family history of alcohol abuse and dependence: Secondary | ICD-10-CM

## 2023-10-12 DIAGNOSIS — X30XXXA Exposure to excessive natural heat, initial encounter: Secondary | ICD-10-CM

## 2023-10-12 DIAGNOSIS — Z79899 Other long term (current) drug therapy: Secondary | ICD-10-CM

## 2023-10-12 DIAGNOSIS — F1210 Cannabis abuse, uncomplicated: Secondary | ICD-10-CM | POA: Diagnosis present

## 2023-10-12 DIAGNOSIS — F17210 Nicotine dependence, cigarettes, uncomplicated: Secondary | ICD-10-CM | POA: Diagnosis not present

## 2023-10-12 DIAGNOSIS — F1010 Alcohol abuse, uncomplicated: Secondary | ICD-10-CM | POA: Diagnosis present

## 2023-10-12 DIAGNOSIS — I10 Essential (primary) hypertension: Secondary | ICD-10-CM | POA: Diagnosis present

## 2023-10-12 DIAGNOSIS — F419 Anxiety disorder, unspecified: Secondary | ICD-10-CM | POA: Diagnosis present

## 2023-10-12 DIAGNOSIS — N179 Acute kidney failure, unspecified: Secondary | ICD-10-CM | POA: Diagnosis not present

## 2023-10-12 DIAGNOSIS — F1290 Cannabis use, unspecified, uncomplicated: Secondary | ICD-10-CM | POA: Diagnosis not present

## 2023-10-12 DIAGNOSIS — D751 Secondary polycythemia: Secondary | ICD-10-CM | POA: Diagnosis present

## 2023-10-12 DIAGNOSIS — F319 Bipolar disorder, unspecified: Secondary | ICD-10-CM | POA: Diagnosis present

## 2023-10-12 DIAGNOSIS — E876 Hypokalemia: Secondary | ICD-10-CM | POA: Diagnosis not present

## 2023-10-12 DIAGNOSIS — T675XXA Heat exhaustion, unspecified, initial encounter: Principal | ICD-10-CM | POA: Diagnosis present

## 2023-10-12 DIAGNOSIS — R42 Dizziness and giddiness: Secondary | ICD-10-CM | POA: Diagnosis present

## 2023-10-12 LAB — COMPREHENSIVE METABOLIC PANEL WITH GFR
ALT: 30 U/L (ref 0–44)
AST: 38 U/L (ref 15–41)
Albumin: 5.1 g/dL — ABNORMAL HIGH (ref 3.5–5.0)
Alkaline Phosphatase: 76 U/L (ref 38–126)
Anion gap: 14 (ref 5–15)
BUN: 25 mg/dL — ABNORMAL HIGH (ref 6–20)
CO2: 21 mmol/L — ABNORMAL LOW (ref 22–32)
Calcium: 9.9 mg/dL (ref 8.9–10.3)
Chloride: 104 mmol/L (ref 98–111)
Creatinine, Ser: 3.03 mg/dL — ABNORMAL HIGH (ref 0.61–1.24)
GFR, Estimated: 27 mL/min — ABNORMAL LOW (ref >60)
Glucose, Bld: 126 mg/dL — ABNORMAL HIGH (ref 70–99)
Potassium: 3.4 mmol/L — ABNORMAL LOW (ref 3.5–5.1)
Sodium: 139 mmol/L (ref 135–145)
Total Bilirubin: 1.3 mg/dL — ABNORMAL HIGH (ref 0.0–1.2)
Total Protein: 9 g/dL — ABNORMAL HIGH (ref 6.5–8.1)

## 2023-10-12 LAB — CBC WITH DIFFERENTIAL/PLATELET
Abs Immature Granulocytes: 0.05 K/uL (ref 0.00–0.07)
Basophils Absolute: 0.1 K/uL (ref 0.0–0.1)
Basophils Relative: 1 %
Eosinophils Absolute: 0 K/uL (ref 0.0–0.5)
Eosinophils Relative: 0 %
HCT: 44.2 % (ref 39.0–52.0)
Hemoglobin: 15.3 g/dL (ref 13.0–17.0)
Immature Granulocytes: 0 %
Lymphocytes Relative: 14 %
Lymphs Abs: 2.2 K/uL (ref 0.7–4.0)
MCH: 31.4 pg (ref 26.0–34.0)
MCHC: 34.6 g/dL (ref 30.0–36.0)
MCV: 90.8 fL (ref 80.0–100.0)
Monocytes Absolute: 1.5 K/uL — ABNORMAL HIGH (ref 0.1–1.0)
Monocytes Relative: 10 %
Neutro Abs: 11.4 K/uL — ABNORMAL HIGH (ref 1.7–7.7)
Neutrophils Relative %: 75 %
Platelets: 447 K/uL — ABNORMAL HIGH (ref 150–400)
RBC: 4.87 MIL/uL (ref 4.22–5.81)
RDW: 13.2 % (ref 11.5–15.5)
WBC: 15.3 K/uL — ABNORMAL HIGH (ref 4.0–10.5)
nRBC: 0 % (ref 0.0–0.2)

## 2023-10-12 LAB — CK: Total CK: 369 U/L (ref 49–397)

## 2023-10-12 MED ORDER — ACETAMINOPHEN 500 MG PO TABS
1000.0000 mg | ORAL_TABLET | Freq: Once | ORAL | Status: AC
  Administered 2023-10-12: 1000 mg via ORAL
  Filled 2023-10-12: qty 2

## 2023-10-12 MED ORDER — SODIUM CHLORIDE 0.9 % IV BOLUS
1000.0000 mL | Freq: Once | INTRAVENOUS | Status: AC
  Administered 2023-10-12: 1000 mL via INTRAVENOUS

## 2023-10-12 NOTE — ED Triage Notes (Signed)
 4mg  Zofran  given en route as well as 500ml bolus saline started.

## 2023-10-12 NOTE — ED Provider Triage Note (Signed)
 Emergency Medicine Provider Triage Evaluation Note  Koby Pickup , a 33 y.o. male  was evaluated in triage.  Pt complains of cramping. Works as a Designer, fashion/clothing. Drank a monster energy drink and then developed cramping and vomiting. Feels better after fluids from paramedics. Has had multiple previous episodes of rhabdomyalsis, renal failure, dehydration and feels same.  Review of Systems  Positive: Cramping, vomiting, nausea, fatigue, syncope Negative: Chest pain, shob  Physical Exam  BP 131/82 (BP Location: Right Arm)   Pulse 96   Temp 97.7 F (36.5 C) (Oral)   Resp 20   Ht 5' 8 (1.727 m)   Wt 79.4 kg   SpO2 100%   BMI 26.61 kg/m  Gen:   Awake, no distress   Resp:  Normal effort  MSK:   Moves extremities without difficulty  Other:    Medical Decision Making  Medically screening exam initiated at 10:30 PM.  Appropriate orders placed.  Romelle Reiley was informed that the remainder of the evaluation will be completed by another provider, this initial triage assessment does not replace that evaluation, and the importance of remaining in the ED until their evaluation is complete.     Francesca Elsie CROME, MD 10/12/23 2232

## 2023-10-12 NOTE — ED Triage Notes (Signed)
 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 Had to go back to work so he was working all day. Has been weak, dizzy since noon. Has syncopal episode at work. Denies injury. Denies headache. (-) thinners. Went home after work and was still feeling weak, dizzy, nauseous, and cramping. No vomiting. Pt ill appearing in triage.

## 2023-10-13 DIAGNOSIS — N179 Acute kidney failure, unspecified: Secondary | ICD-10-CM | POA: Diagnosis present

## 2023-10-13 DIAGNOSIS — F1910 Other psychoactive substance abuse, uncomplicated: Secondary | ICD-10-CM

## 2023-10-13 DIAGNOSIS — R42 Dizziness and giddiness: Secondary | ICD-10-CM | POA: Diagnosis present

## 2023-10-13 DIAGNOSIS — R55 Syncope and collapse: Secondary | ICD-10-CM | POA: Diagnosis present

## 2023-10-13 DIAGNOSIS — F40240 Claustrophobia: Secondary | ICD-10-CM | POA: Diagnosis present

## 2023-10-13 DIAGNOSIS — Z818 Family history of other mental and behavioral disorders: Secondary | ICD-10-CM | POA: Diagnosis not present

## 2023-10-13 DIAGNOSIS — F319 Bipolar disorder, unspecified: Secondary | ICD-10-CM | POA: Diagnosis present

## 2023-10-13 DIAGNOSIS — X30XXXA Exposure to excessive natural heat, initial encounter: Secondary | ICD-10-CM | POA: Diagnosis not present

## 2023-10-13 DIAGNOSIS — F1410 Cocaine abuse, uncomplicated: Secondary | ICD-10-CM | POA: Diagnosis present

## 2023-10-13 DIAGNOSIS — Z79899 Other long term (current) drug therapy: Secondary | ICD-10-CM | POA: Diagnosis not present

## 2023-10-13 DIAGNOSIS — E876 Hypokalemia: Secondary | ICD-10-CM | POA: Diagnosis present

## 2023-10-13 DIAGNOSIS — E860 Dehydration: Secondary | ICD-10-CM | POA: Diagnosis present

## 2023-10-13 DIAGNOSIS — D751 Secondary polycythemia: Secondary | ICD-10-CM | POA: Diagnosis present

## 2023-10-13 DIAGNOSIS — F1010 Alcohol abuse, uncomplicated: Secondary | ICD-10-CM | POA: Diagnosis present

## 2023-10-13 DIAGNOSIS — F17210 Nicotine dependence, cigarettes, uncomplicated: Secondary | ICD-10-CM | POA: Diagnosis present

## 2023-10-13 DIAGNOSIS — F1090 Alcohol use, unspecified, uncomplicated: Secondary | ICD-10-CM

## 2023-10-13 DIAGNOSIS — D72829 Elevated white blood cell count, unspecified: Secondary | ICD-10-CM | POA: Diagnosis present

## 2023-10-13 DIAGNOSIS — I10 Essential (primary) hypertension: Secondary | ICD-10-CM | POA: Diagnosis present

## 2023-10-13 DIAGNOSIS — F419 Anxiety disorder, unspecified: Secondary | ICD-10-CM | POA: Diagnosis present

## 2023-10-13 DIAGNOSIS — Z811 Family history of alcohol abuse and dependence: Secondary | ICD-10-CM | POA: Diagnosis not present

## 2023-10-13 DIAGNOSIS — T675XXA Heat exhaustion, unspecified, initial encounter: Secondary | ICD-10-CM | POA: Diagnosis present

## 2023-10-13 DIAGNOSIS — F4310 Post-traumatic stress disorder, unspecified: Secondary | ICD-10-CM | POA: Diagnosis present

## 2023-10-13 DIAGNOSIS — F1210 Cannabis abuse, uncomplicated: Secondary | ICD-10-CM | POA: Diagnosis present

## 2023-10-13 LAB — BASIC METABOLIC PANEL WITH GFR
Anion gap: 15 (ref 5–15)
Anion gap: 13 (ref 5–15)
BUN: 26 mg/dL — ABNORMAL HIGH (ref 6–20)
BUN: 27 mg/dL — ABNORMAL HIGH (ref 6–20)
CO2: 21 mmol/L — ABNORMAL LOW (ref 22–32)
CO2: 21 mmol/L — ABNORMAL LOW (ref 22–32)
Calcium: 9.1 mg/dL (ref 8.9–10.3)
Calcium: 9 mg/dL (ref 8.9–10.3)
Chloride: 102 mmol/L (ref 98–111)
Chloride: 101 mmol/L (ref 98–111)
Creatinine, Ser: 2.35 mg/dL — ABNORMAL HIGH (ref 0.61–1.24)
Creatinine, Ser: 2.04 mg/dL — ABNORMAL HIGH (ref 0.61–1.24)
GFR, Estimated: 36 mL/min — ABNORMAL LOW (ref >60)
GFR, Estimated: 43 mL/min — ABNORMAL LOW (ref >60)
Glucose, Bld: 120 mg/dL — ABNORMAL HIGH (ref 70–99)
Glucose, Bld: 100 mg/dL — ABNORMAL HIGH (ref 70–99)
Potassium: 3.2 mmol/L — ABNORMAL LOW (ref 3.5–5.1)
Potassium: 3.3 mmol/L — ABNORMAL LOW (ref 3.5–5.1)
Sodium: 138 mmol/L (ref 135–145)
Sodium: 135 mmol/L (ref 135–145)

## 2023-10-13 LAB — URINALYSIS, W/ REFLEX TO CULTURE (INFECTION SUSPECTED)
Bacteria, UA: NONE SEEN
Bilirubin Urine: NEGATIVE
Glucose, UA: NEGATIVE mg/dL
Hgb urine dipstick: NEGATIVE
Ketones, ur: NEGATIVE mg/dL
Leukocytes,Ua: NEGATIVE
Nitrite: NEGATIVE
Protein, ur: NEGATIVE mg/dL
Specific Gravity, Urine: 1.025 (ref 1.005–1.030)
pH: 5 (ref 5.0–8.0)

## 2023-10-13 LAB — CBC
HCT: 39.4 % (ref 39.0–52.0)
Hemoglobin: 13.5 g/dL (ref 13.0–17.0)
MCH: 31.6 pg (ref 26.0–34.0)
MCHC: 34.3 g/dL (ref 30.0–36.0)
MCV: 92.3 fL (ref 80.0–100.0)
Platelets: 377 K/uL (ref 150–400)
RBC: 4.27 MIL/uL (ref 4.22–5.81)
RDW: 13.4 % (ref 11.5–15.5)
WBC: 11.7 K/uL — ABNORMAL HIGH (ref 4.0–10.5)
nRBC: 0 % (ref 0.0–0.2)

## 2023-10-13 LAB — MAGNESIUM: Magnesium: 2.3 mg/dL (ref 1.7–2.4)

## 2023-10-13 LAB — HIV ANTIBODY (ROUTINE TESTING W REFLEX): HIV Screen 4th Generation wRfx: NONREACTIVE

## 2023-10-13 MED ORDER — SODIUM CHLORIDE 0.9 % IV SOLN: INTRAVENOUS | Status: DC

## 2023-10-13 MED ORDER — VITAMIN B-1 100 MG PO TABS: 100.0000 mg | ORAL_TABLET | Freq: Every day | ORAL | 0 refills | Status: AC

## 2023-10-13 MED ORDER — LORAZEPAM 2 MG/ML IJ SOLN: 1.0000 mg | INTRAMUSCULAR | Status: DC | PRN

## 2023-10-13 MED ORDER — LORAZEPAM 1 MG PO TABS: 1.0000 mg | ORAL_TABLET | ORAL | Status: DC | PRN

## 2023-10-13 MED ORDER — ADULT MULTIVITAMIN W/MINERALS CH
1.0000 | ORAL_TABLET | Freq: Every day | ORAL | Status: DC
  Administered 2023-10-13: 1 via ORAL
  Filled 2023-10-13: qty 1

## 2023-10-13 MED ORDER — THIAMINE MONONITRATE 100 MG PO TABS
100.0000 mg | ORAL_TABLET | Freq: Every day | ORAL | Status: DC
  Administered 2023-10-13: 100 mg via ORAL
  Filled 2023-10-13: qty 1

## 2023-10-13 MED ORDER — THIAMINE HCL 100 MG/ML IJ SOLN: 100.0000 mg | Freq: Every day | INTRAMUSCULAR | Status: DC

## 2023-10-13 MED ORDER — POTASSIUM CHLORIDE 20 MEQ PO PACK
40.0000 meq | PACK | Freq: Once | ORAL | Status: AC
  Administered 2023-10-13: 40 meq via ORAL
  Filled 2023-10-13: qty 2

## 2023-10-13 MED ORDER — ENOXAPARIN SODIUM 40 MG/0.4ML IJ SOSY
40.0000 mg | PREFILLED_SYRINGE | INTRAMUSCULAR | Status: DC
  Administered 2023-10-13: 40 mg via SUBCUTANEOUS
  Filled 2023-10-13: qty 0.4

## 2023-10-13 MED ORDER — ACETAMINOPHEN 650 MG RE SUPP: 650.0000 mg | Freq: Four times a day (QID) | RECTAL | Status: DC | PRN

## 2023-10-13 MED ORDER — POTASSIUM CHLORIDE CRYS ER 20 MEQ PO TBCR
40.0000 meq | EXTENDED_RELEASE_TABLET | Freq: Once | ORAL | Status: AC
  Administered 2023-10-13: 40 meq via ORAL
  Filled 2023-10-13: qty 2

## 2023-10-13 MED ORDER — ACETAMINOPHEN 325 MG PO TABS: 650.0000 mg | ORAL_TABLET | Freq: Four times a day (QID) | ORAL | Status: DC | PRN

## 2023-10-13 MED ORDER — ONDANSETRON HCL 4 MG/2ML IJ SOLN: 4.0000 mg | Freq: Once | INTRAMUSCULAR | Status: DC

## 2023-10-13 MED ORDER — SODIUM CHLORIDE 0.9 % IV SOLN: Freq: Once | INTRAVENOUS | Status: DC

## 2023-10-13 MED ORDER — FOLIC ACID 1 MG PO TABS: 1.0000 mg | ORAL_TABLET | Freq: Every day | ORAL | 0 refills | Status: AC

## 2023-10-13 MED ORDER — FOLIC ACID 1 MG PO TABS
1.0000 mg | ORAL_TABLET | Freq: Every day | ORAL | Status: DC
  Administered 2023-10-13: 1 mg via ORAL
  Filled 2023-10-13: qty 1
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
## 2023-10-13 NOTE — Discharge Instructions (Signed)

## 2023-10-13 NOTE — ED Notes (Signed)
 Pt put on monitor and CCMD called
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
## 2023-10-13 NOTE — Progress Notes (Signed)
 CSW added substance abuse resources to patient's AVS.  Edwin Dada, MSW, LCSW Transitions of Care  Clinical Social Worker II [phone]

## 2023-10-13 NOTE — ED Provider Notes (Signed)
 [place] [facility] DE[facility]MENT AT [facility] Provider Note   CSN: [id number] Arrival date & time: 10/12/23  2204     Patient presents with: Heat Exposure   Nicholas Lopez is a 33 y.o. male.  Patient with past medical history significant for history of rhabdomyolysis, bipolar 1 disorder, acute kidney injury who works outdoors as a Designer, fashion/clothing and recently had an episode of heat exhaustion presents to the emergency department due to weakness and dizziness which began around noon.  He had a syncopal episode while at work.  He states he went home after work and was still feeling weak dizzy nauseated with some muscle cramping.  He denies emesis.  He states he drank 2 monster energy drinks and drink a lot of water at home.  Patient Nuys chest pain, abdominal pain, shortness of breath, fever   HPI     Prior to Admission medications   Medication Sig Start Date End Date Taking? Authorizing Provider  amLODipine  (NORVASC ) 5 MG tablet Take 1 tablet (5 mg total) by mouth 2 (two) times daily. 12/13/22 12/13/23  Darliss Rogue, MD  amLODipine  (NORVASC ) 5 MG tablet Take 1 tablet (5 mg total) by mouth 2 (two) times daily. 12/13/22 12/13/23  Darliss Rogue, MD  Blood Pressure Monitoring (BLOOD PRESSURE MONITOR/M CUFF) MISC 1 Device by Does not apply route daily. 07/05/22   Fleming, Zelda W, NP  gabapentin  (NEURONTIN ) 300 MG capsule TAKE 1 CAPSULE(300 MG) BY MOUTH THREE TIMES DAILY AS NEEDED 09/09/22   Fleming, Zelda W, NP  hydrOXYzine  (ATARAX ) 25 MG tablet Take 1 tablet (25 mg total) by mouth every 6 (six) hours as needed. 09/12/22   Fleming, Zelda W, NP  methocarbamol  (ROBAXIN ) 500 MG tablet Take 1 tablet (500 mg total) by mouth every 6 (six) hours as needed for muscle spasms. 07/05/22   Fleming, Zelda W, NP  oxyCODONE -acetaminophen  (PERCOCET) 10-325 MG tablet Take 1 tablet by mouth every 8 (eight) hours as needed for pain. Patient not taking: Reported on 12/12/2022 08/18/22   Dorinda Drue DASEN, MD   oxyCODONE -acetaminophen  (PERCOCET) 5-325 MG tablet Take 1 tablet by mouth every 8 (eight) hours as needed for severe pain. Patient not taking: Reported on 12/12/2022 08/18/22   Dorinda Drue DASEN, MD  QUEtiapine  (SEROQUEL ) 50 MG tablet Take 50 mg by mouth at bedtime. As needed 08/10/22   [provider]  sertraline  (ZOLOFT ) 25 MG tablet Take 1 tablet (25 mg total) by mouth daily with breakfast. 12/12/22   Eappen, Saramma, MD    Allergies: Patient has no known allergies.    Review of Systems  Updated Vital Signs BP 131/82 (BP Location: Right Arm)   Pulse 96   Temp 97.7 F (36.5 C) (Oral)   Resp 20   Ht 5' 8 (1.727 m)   Wt 79.4 kg   SpO2 100%   BMI 26.61 kg/m   Physical Exam Vitals and nursing note reviewed.  Constitutional:      General: He is not in acute distress.    Appearance: He is well-developed.  HENT:     Head: Normocephalic and atraumatic.  Eyes:     Conjunctiva/sclera: Conjunctivae normal.  Cardiovascular:     Rate and Rhythm: Normal rate and regular rhythm.     Heart sounds: No murmur heard. Pulmonary:     Effort: Pulmonary effort is normal. No respiratory distress.     Breath sounds: Normal breath sounds.  Abdominal:     Palpations: Abdomen is soft.     Tenderness:  There is no abdominal tenderness.  Musculoskeletal:        General: No swelling.     Cervical back: Neck supple.  Skin:    General: Skin is warm and dry.     Capillary Refill: Capillary refill takes less than 2 seconds.  Neurological:     Mental Status: He is alert.  Psychiatric:        Mood and Affect: Mood normal.     (all labs ordered are listed, but only abnormal results are displayed) Labs Reviewed  COMPREHENSIVE METABOLIC PANEL WITH GFR - Abnormal; Notable for the following components:      Result Value   Potassium 3.4 (*)    CO2 21 (*)    Glucose, Bld 126 (*)    BUN 25 (*)    Creatinine, Ser 3.03 (*)    Total Protein 9.0 (*)    Albumin  5.1 (*)    Total Bilirubin 1.3 (*)     GFR, Estimated 27 (*)    All other components within normal limits  CBC WITH DIFFERENTIAL/PLATELET - Abnormal; Notable for the following components:   WBC 15.3 (*)    Platelets 447 (*)    Neutro Abs 11.4 (*)    Monocytes Absolute 1.5 (*)    All other components within normal limits  BASIC METABOLIC PANEL WITH GFR - Abnormal; Notable for the following components:   Potassium 3.2 (*)    CO2 21 (*)    Glucose, Bld 120 (*)    BUN 26 (*)    Creatinine, Ser 2.35 (*)    GFR, Estimated 36 (*)    All other components within normal limits  CK  URINALYSIS, W/ REFLEX TO CULTURE (INFECTION SUSPECTED)    EKG: None  Radiology: No results found.   Procedures   Medications Ordered in the ED  sodium chloride  0.9 % bolus 1,000 mL (0 mLs Intravenous Stopped 10/13/23 0234)  acetaminophen  (TYLENOL ) tablet 1,000 mg (1,000 mg Oral Given 10/12/23 2242)                                    Medical Decision Making Amount and/or Complexity of Data Reviewed Labs: ordered.  Risk Prescription drug management. Decision regarding hospitalization.   This patient presents to the ED for concern of dizziness, muscle cramps, nausea, this involves an extensive number of treatment options, and is a complaint that carries with it a high risk of complications and morbidity.  The differential diagnosis includes heat exhaustion, rhabdomyolysis, acute kidney injury, endocrine abnormality, metabolic abnormality, others   Co morbidities / Chronic conditions that complicate the patient evaluation  History of acute kidney injury, rhabdomyolysis   Additional history obtained:  Additional history obtained from EMR External records from outside source obtained and reviewed including outside hospital notes from February when patient had similar presentation and findings   Lab Tests:  I Ordered, and personally interpreted labs.  The pertinent results include: Initial creatinine 3.03, repeat 2.35.  Baseline  appears to be less than 1.  Leukocytosis with a white count of 15,300, CK3 69   Problem List / ED Course / Critical interventions / Medication management   I ordered medication including saline bolus, Tylenol , saline infusion, Zofran  Reevaluation of the patient after these medicines showed that the patient improved I have reviewed the patients home medicines and have made adjustments as needed   Consultations Obtained:  I requested consultation with the hospitalist,  Dr. Alfornia and discussed lab and imaging findings as well as pertinent plan - they recommend: Admission   Social Determinants of Health:  Patient has Medicaid for his primary health insurance type, is an occasional smoker   Test / Admission - Considered:  Patient with significant acute kidney injury upon initial workup.  He was treated with at least 1 L of normal saline and also drink between half a liter and a liter of water.  His metabolic panel was rechecked and his creatinine is still significantly elevated at over twice his baseline.  At this time I feel patient would benefit from admission for continued IV fluids and for reassessment of kidney function after observation.      Final diagnoses:  Heat exhaustion, initial encounter  AKI (acute kidney injury) [facility])  Dehydration    ED Discharge Orders     None          Logan Ubaldo KATHEE DEVONNA 10/13/23 0408    Carita Senior, MD 10/13/23 [phone]

## 2023-10-26 ENCOUNTER — Ambulatory Visit: Admitting: Dermatology

## 2024-01-15 ENCOUNTER — Encounter: Admitting: Nurse Practitioner
